# Patient Record
Sex: Male | Born: 1970 | Race: White | Hispanic: No | Marital: Married | Smoking: Never smoker
Health system: Southern US, Community
[De-identification: ages and names within clinical notes are randomized; demographics above are authoritative.]

## PROBLEM LIST (undated history)

## (undated) DIAGNOSIS — E7800 Pure hypercholesterolemia, unspecified: Secondary | ICD-10-CM

## (undated) DIAGNOSIS — J309 Allergic rhinitis, unspecified: Secondary | ICD-10-CM

## (undated) DIAGNOSIS — I10 Essential (primary) hypertension: Secondary | ICD-10-CM

## (undated) DIAGNOSIS — F32A Depression, unspecified: Secondary | ICD-10-CM

## (undated) HISTORY — DX: Pure hypercholesterolemia, unspecified: E78.00

## (undated) HISTORY — DX: Depression, unspecified: F32.A

## (undated) HISTORY — DX: Essential (primary) hypertension: I10

## (undated) HISTORY — DX: Allergic rhinitis, unspecified: J30.9

## (undated) HISTORY — PX: NASAL SEPTUM SURGERY: SHX37

---

## 2014-08-19 ENCOUNTER — Ambulatory Visit: Payer: Self-pay

## 2014-08-19 ENCOUNTER — Ambulatory Visit (INDEPENDENT_AMBULATORY_CARE_PROVIDER_SITE_OTHER): Payer: BLUE CROSS/BLUE SHIELD | Admitting: Podiatry

## 2014-08-19 VITALS — BP 132/95 | HR 82 | Resp 15

## 2014-08-19 DIAGNOSIS — M722 Plantar fascial fibromatosis: Secondary | ICD-10-CM | POA: Diagnosis not present

## 2014-08-19 MED ORDER — TRIAMCINOLONE ACETONIDE 10 MG/ML IJ SUSP
10.0000 mg | Freq: Once | INTRAMUSCULAR | Status: AC
  Administered 2014-08-19: 10 mg

## 2014-08-19 MED ORDER — DICLOFENAC SODIUM 75 MG PO TBEC: 75.0000 mg | DELAYED_RELEASE_TABLET | Freq: Two times a day (BID) | ORAL | Status: DC

## 2014-08-19 NOTE — Progress Notes (Signed)
   Subjective:    Patient ID: Don Cochran, male    DOB: 05/17/1970, 44 y.o.   MRN: [id number]  HPI  pT PRESENTS WITH INTERMITTENT RIGHT HEEL PAIN, HAS HAD THIS PROBLEM FOR APPROX 1 YR, RECENTLY HAS TRIED MORE SUPPORTIVE SHOES WHICH HAS ALLEVIATED THE PAIN  Review of Systems  Allergic/Immunologic: Positive for environmental allergies.  All other systems reviewed and are negative.      Objective:   Physical Exam        Assessment & Plan:

## 2014-08-19 NOTE — Patient Instructions (Signed)

## 2014-08-20 NOTE — Progress Notes (Signed)
Subjective:     Patient ID: Don Cochran, male   DOB: 05/16/1970, 44 y.o.   MRN: [id number]  HPI patient presents stating I been having pain on and off and worse recently in my right heel for about one year. States it's worse when getting up in the morning and after periods of sitting   Review of Systems  All other systems reviewed and are negative.      Objective:   Physical Exam  Constitutional: He is oriented to person, place, and time.  Cardiovascular: Intact distal pulses.   Musculoskeletal: Normal range of motion.  Neurological: He is oriented to person, place, and time.  Skin: Skin is warm.  Nursing note and vitals reviewed.  neurovascular status intact muscle strength adequate with range of motion of the subtalar midtarsal joint within normal limits. Patient's found to have quite a bit of discomfort with inflammation and fluid buildup around the medial band of the right heel at the insertion of the tendon into the calcaneus with moderate depression of the arch. Patient has good digital perfusion and is well oriented 3     Assessment:     Plantar fasciitis with inflammation medial heel right    Plan:     H&P and x-rays reviewed with patient. Injected the right plantar fascia 3 mg Kenalog 5 mg Xylocaine and applied fascial brace with instructions on usage. Gave instructions on physical therapy and oral anti-inflammatories and discussed long-term orthotics with this patient

## 2014-08-21 ENCOUNTER — Telehealth: Payer: Self-pay | Admitting: *Deleted

## 2014-08-21 MED ORDER — DICLOFENAC SODIUM 75 MG PO TBEC: 75.0000 mg | DELAYED_RELEASE_TABLET | Freq: Two times a day (BID) | ORAL | Status: DC

## 2014-08-21 NOTE — Telephone Encounter (Signed)
Pt states he was seen by Dr. Charlsie Merlesegal on 08/19/2014, and the medication was not called to his pharmacy.  I reviewed pt's chart and no pharmacy was stated.  [facility] [address] at pt's request.

## 2014-08-27 ENCOUNTER — Ambulatory Visit (INDEPENDENT_AMBULATORY_CARE_PROVIDER_SITE_OTHER): Payer: BLUE CROSS/BLUE SHIELD | Admitting: Podiatry

## 2014-08-27 DIAGNOSIS — M722 Plantar fascial fibromatosis: Secondary | ICD-10-CM

## 2014-08-27 NOTE — Progress Notes (Signed)
   Subjective:    Patient ID: Don Cochran, male    DOB: 05/11/1970, 44 y.o.   MRN: [id number]  HPI Comments: Patient is doing much better with plantar fasciatis after using braces and receiving shots. May a candidate for orthotics based on chronic nature of his condition.    Review of Systems     Objective:   Physical Exam        Assessment & Plan:

## 2014-08-27 NOTE — Progress Notes (Signed)
Subjective:     Patient ID: Don EdwardSteven Chipman, male   DOB: 09/24/1970, 44 y.o.   MRN: [id number]  HPI patient presents stating I have had this for a long time and it's quite a bit better now but I still no that I need foot support   Review of Systems     Objective:   Physical Exam Neurovascular status intact with significant discomfort reduction right plantar heel with inflammation still noted of a mild nature. Also noted to have depression of the arch    Assessment:     Plantar fasciitis right with inflammation that is improved but still sore with mechanical dysfunction    Plan:     Reviewed importance of physical therapy supportive shoe gear usage and at this time scanned for custom Berkley type orthotics to give good support of the foot

## 2014-10-14 ENCOUNTER — Telehealth: Payer: Self-pay | Admitting: *Deleted

## 2014-10-14 NOTE — Telephone Encounter (Signed)
Pt states his plantar fasciitis symptoms have subsided, does he need to continue the antiinflammatory?  I instructed pt to complete the medication he had at this time, and use the remaining refill for flare-up, but if problem continued or flared up make an appt to be seen.

## 2014-10-17 ENCOUNTER — Ambulatory Visit: Payer: BLUE CROSS/BLUE SHIELD | Admitting: *Deleted

## 2014-10-17 DIAGNOSIS — M722 Plantar fascial fibromatosis: Secondary | ICD-10-CM

## 2014-10-17 NOTE — Progress Notes (Signed)
Patient ID: Don Cochran, male   DOB: 07/22/1970, 44 y.o.   MRN: [id number] Patient presents for orthotic pick up.  Verbal and written break in and wear instructions given.  Patient will follow up in 4 weeks if symptoms worsen or fail to improve.

## 2014-10-17 NOTE — Patient Instructions (Signed)

## 2014-11-09 ENCOUNTER — Other Ambulatory Visit: Payer: Self-pay | Admitting: Podiatry

## 2014-11-10 NOTE — Telephone Encounter (Signed)
Pt needs an appt prior to future refills. 

## 2015-07-09 DIAGNOSIS — L918 Other hypertrophic disorders of the skin: Secondary | ICD-10-CM | POA: Diagnosis not present

## 2015-07-09 DIAGNOSIS — L2084 Intrinsic (allergic) eczema: Secondary | ICD-10-CM | POA: Diagnosis not present

## 2015-11-04 DIAGNOSIS — Z0001 Encounter for general adult medical examination with abnormal findings: Secondary | ICD-10-CM | POA: Diagnosis not present

## 2015-11-04 DIAGNOSIS — E7800 Pure hypercholesterolemia, unspecified: Secondary | ICD-10-CM | POA: Diagnosis not present

## 2015-11-04 DIAGNOSIS — F429 Obsessive-compulsive disorder, unspecified: Secondary | ICD-10-CM | POA: Diagnosis not present

## 2015-11-04 DIAGNOSIS — M249 Joint derangement, unspecified: Secondary | ICD-10-CM | POA: Diagnosis not present

## 2015-11-04 DIAGNOSIS — M7741 Metatarsalgia, right foot: Secondary | ICD-10-CM | POA: Diagnosis not present

## 2016-01-11 DIAGNOSIS — H524 Presbyopia: Secondary | ICD-10-CM | POA: Diagnosis not present

## 2016-01-16 DIAGNOSIS — H5210 Myopia, unspecified eye: Secondary | ICD-10-CM | POA: Diagnosis not present

## 2016-04-05 DIAGNOSIS — L309 Dermatitis, unspecified: Secondary | ICD-10-CM | POA: Diagnosis not present

## 2016-04-05 DIAGNOSIS — L859 Epidermal thickening, unspecified: Secondary | ICD-10-CM | POA: Diagnosis not present

## 2016-05-18 DIAGNOSIS — F429 Obsessive-compulsive disorder, unspecified: Secondary | ICD-10-CM | POA: Diagnosis not present

## 2016-05-18 DIAGNOSIS — E7800 Pure hypercholesterolemia, unspecified: Secondary | ICD-10-CM | POA: Diagnosis not present

## 2016-06-16 DIAGNOSIS — B029 Zoster without complications: Secondary | ICD-10-CM | POA: Diagnosis not present

## 2016-09-09 DIAGNOSIS — H1032 Unspecified acute conjunctivitis, left eye: Secondary | ICD-10-CM | POA: Diagnosis not present

## 2017-02-01 DIAGNOSIS — M79671 Pain in right foot: Secondary | ICD-10-CM | POA: Diagnosis not present

## 2017-02-01 DIAGNOSIS — Z23 Encounter for immunization: Secondary | ICD-10-CM | POA: Diagnosis not present

## 2017-02-01 DIAGNOSIS — H9193 Unspecified hearing loss, bilateral: Secondary | ICD-10-CM | POA: Diagnosis not present

## 2017-02-01 DIAGNOSIS — Z0001 Encounter for general adult medical examination with abnormal findings: Secondary | ICD-10-CM | POA: Diagnosis not present

## 2017-02-01 DIAGNOSIS — E7800 Pure hypercholesterolemia, unspecified: Secondary | ICD-10-CM | POA: Diagnosis not present

## 2017-02-01 DIAGNOSIS — H6122 Impacted cerumen, left ear: Secondary | ICD-10-CM | POA: Diagnosis not present

## 2017-02-08 DIAGNOSIS — Z23 Encounter for immunization: Secondary | ICD-10-CM | POA: Diagnosis not present

## 2017-03-08 DIAGNOSIS — H524 Presbyopia: Secondary | ICD-10-CM | POA: Diagnosis not present

## 2017-03-08 DIAGNOSIS — H903 Sensorineural hearing loss, bilateral: Secondary | ICD-10-CM | POA: Diagnosis not present

## 2017-03-16 DIAGNOSIS — Z23 Encounter for immunization: Secondary | ICD-10-CM | POA: Diagnosis not present

## 2017-04-06 DIAGNOSIS — D485 Neoplasm of uncertain behavior of skin: Secondary | ICD-10-CM | POA: Diagnosis not present

## 2017-04-06 DIAGNOSIS — Z86018 Personal history of other benign neoplasm: Secondary | ICD-10-CM | POA: Diagnosis not present

## 2017-04-06 DIAGNOSIS — D225 Melanocytic nevi of trunk: Secondary | ICD-10-CM | POA: Diagnosis not present

## 2017-04-06 DIAGNOSIS — L821 Other seborrheic keratosis: Secondary | ICD-10-CM | POA: Diagnosis not present

## 2017-08-01 DIAGNOSIS — F429 Obsessive-compulsive disorder, unspecified: Secondary | ICD-10-CM | POA: Diagnosis not present

## 2017-08-01 DIAGNOSIS — E7800 Pure hypercholesterolemia, unspecified: Secondary | ICD-10-CM | POA: Diagnosis not present

## 2017-08-06 DIAGNOSIS — J309 Allergic rhinitis, unspecified: Secondary | ICD-10-CM | POA: Diagnosis not present

## 2017-08-06 DIAGNOSIS — B309 Viral conjunctivitis, unspecified: Secondary | ICD-10-CM | POA: Diagnosis not present

## 2017-11-27 DIAGNOSIS — H5210 Myopia, unspecified eye: Secondary | ICD-10-CM | POA: Diagnosis not present

## 2018-01-20 NOTE — Progress Notes (Signed)
[name] [address] [place], [place] [address] Phone: [phone] Subjective:    I Don Cochran am serving as a Neurosurgeon for Dr. Antoine Cochran.   I'm seeing this patient by the request  of:   Cochran, Don Dance, MD   CC: Bilateral hand pain  ION:[id number]  Don Cochran is a 47 y.o. male coming in with complaint of bilateral hand pain. From the elbow to his hands and knees to his feet are painful. Knows that it is an overuse injury that started about 10 years ago. No numbness and tingling noted. Sometimes he does get numbness and tingling in the 3rd and 4th finger when his elbows at a certain position. Thumb muscles fatigue at times. Works at a [employer]. Left knee is painful going up and down stairs. Going down is most painful. Lateral right foot is painful. Has a history of ganglion cyst and plantar fascia. Also has an issue of lateral epicondylitis. No numbness and tingling to the toes noted. Hasn't been able to exercise due to pain.  Onset- Chronic (2-3 years) Location- Wrist Patient states that the pain is not severe to stop him from any activity but continues to give difficulty from time to time.    History reviewed. No pertinent past medical history. History reviewed. No pertinent surgical history. Social History   Socioeconomic History  . Marital status: Married    Spouse name: Not on file  . Number of children: Not on file  . Years of education: Not on file  . Highest education level: Not on file  Occupational History  . Not on file  Social Needs  . Financial resource strain: Not on file  . Food insecurity:    Worry: Not on file    Inability: Not on file  . Transportation needs:    Medical: Not on file    Non-medical: Not on file  Tobacco Use  . Smoking status: Not on file  Substance and Sexual Activity  . Alcohol use: Not on file  . Drug use: Not on file  . Sexual activity: Not on file  Lifestyle  . Physical activity:      Days per week: Not on file    Minutes per session: Not on file  . Stress: Not on file  Relationships  . Social connections:    Talks on phone: Not on file    Gets together: Not on file    Attends religious service: Not on file    Active member of club or organization: Not on file    Attends meetings of clubs or organizations: Not on file    Relationship status: Not on file  Other Topics Concern  . Not on file  Social History Narrative  . Not on file   No Known Allergies History reviewed. No pertinent family history.   Current Outpatient Medications (Cardiovascular):  .  rosuvastatin (CRESTOR) 10 MG tablet, Take 10 mg by mouth daily.  Current Outpatient Medications (Respiratory):  .  loratadine (CLARITIN) 10 MG tablet, Take 10 mg by mouth daily.  Current Outpatient Medications (Analgesics):  .  diclofenac (VOLTAREN) 75 MG EC tablet, TAKE 1 TABLET (75 MG TOTAL) BY MOUTH 2 (TWO) TIMES DAILY. Marland Kitchen  ibuprofen (ADVIL,MOTRIN) 200 MG tablet, Take 200 mg by mouth every 6 (six) hours as needed.   Current Outpatient Medications (Other):  .  valACYclovir (VALTREX) 500 MG tablet, Take 500 mg by mouth 2 (two) times daily. Marland Kitchen  venlafaxine XR (  EFFEXOR-XR) 150 MG 24 hr capsule, Take 150 mg by mouth daily with breakfast. .  Diclofenac Sodium (PENNSAID) 2 % SOLN, Place 2 g onto the skin 2 (two) times daily. Marland Kitchen  gabapentin (NEURONTIN) 100 MG capsule, Take 2 capsules (200 mg total) by mouth at bedtime.    Past medical history, social, surgical and family history all reviewed in electronic medical record.  No pertanent information unless stated regarding to the chief complaint.   Review of Systems:  No headache, visual changes, nausea, vomiting, diarrhea, constipation, dizziness, abdominal pain, skin rash, fevers, chills, night sweats, weight loss, swollen lymph nodes, body aches, joint swelling,  chest pain, shortness of breath, mood changes.  Positive muscle aches  Objective  Blood pressure  132/90, pulse 84, height 5\' 6"  (1.676 m), weight 194 lb (88 kg), SpO2 97 %.   General: No apparent distress alert and oriented x3 mood and affect normal, dressed appropriately.  HEENT: Pupils equal, extraocular movements intact  Respiratory: Patient's speak in full sentences and does not appear short of breath  Cardiovascular: No lower extremity edema, non tender, no erythema  Skin: Warm dry intact with no signs of infection or rash on extremities or on axial skeleton.  Abdomen: Soft nontender  Neuro: Cranial nerves II through XII are intact, neurovascularly intact in all extremities with 2+ DTRs and 2+ pulses.  Lymph: No lymphadenopathy of posterior or anterior cervical chain or axillae bilaterally.  Gait normal with good balance and coordination.  MSK:  Non tender with full range of motion and good stability and symmetric strength and tone of shoulders,  wrist, hip, knee and ankles bilaterally.  Elbow: Bilateral Unremarkable to inspection. Range of motion full pronation, supination, flexion, extension. Strength is full to all of the above directions Stable to varus, valgus stress. Negative moving valgus stress test. Tender over the ulnar groove bilaterally Ulnar nerve does subluxation bilaterally Positive cubital tunnel Tinel's.`   Musculoskeletal ultrasound was performed and interpreted by Terrilee Files D.O.   Elbow:  Lateral epicondyle and common extensor tendon origin visualized.  No edema, effusions, or avulsions seen.  Ulnar nerve does seem to be significantly superficial.  Does not seem to sit in the ulnar groove.  Mild chronic hypoechoic changes surrounding the area.  No increase in Doppler flow.  IMPRESSION: Ulnar subluxation    Impression and Recommendations:     This case required medical decision making of moderate complexity. The above documentation has been reviewed and is accurate and complete Judi Saa, DO       Note: This dictation was prepared with Dragon  dictation along with smaller phrase technology. Any transcriptional errors that result from this process are unintentional.

## 2018-01-22 ENCOUNTER — Ambulatory Visit: Payer: Self-pay

## 2018-01-22 ENCOUNTER — Ambulatory Visit: Payer: BLUE CROSS/BLUE SHIELD | Admitting: Family Medicine

## 2018-01-22 ENCOUNTER — Other Ambulatory Visit (INDEPENDENT_AMBULATORY_CARE_PROVIDER_SITE_OTHER): Payer: BLUE CROSS/BLUE SHIELD

## 2018-01-22 ENCOUNTER — Encounter: Payer: Self-pay | Admitting: Family Medicine

## 2018-01-22 VITALS — BP 132/90 | HR 84 | Ht 66.0 in | Wt 194.0 lb

## 2018-01-22 DIAGNOSIS — M2550 Pain in unspecified joint: Secondary | ICD-10-CM

## 2018-01-22 DIAGNOSIS — M25531 Pain in right wrist: Secondary | ICD-10-CM

## 2018-01-22 DIAGNOSIS — M25532 Pain in left wrist: Principal | ICD-10-CM

## 2018-01-22 DIAGNOSIS — G5620 Lesion of ulnar nerve, unspecified upper limb: Secondary | ICD-10-CM

## 2018-01-22 LAB — COMPREHENSIVE METABOLIC PANEL
ALBUMIN: 4.9 g/dL (ref 3.5–5.2)
ALT: 33 U/L (ref 0–53)
AST: 21 U/L (ref 0–37)
Alkaline Phosphatase: 53 U/L (ref 39–117)
BUN: 15 mg/dL (ref 6–23)
CHLORIDE: 101 meq/L (ref 96–112)
CO2: 30 mEq/L (ref 19–32)
CREATININE: 0.99 mg/dL (ref 0.40–1.50)
Calcium: 9.6 mg/dL (ref 8.4–10.5)
GFR: 86.01 mL/min (ref >60.00)
GLUCOSE: 94 mg/dL (ref 70–99)
Potassium: 4.1 mEq/L (ref 3.5–5.1)
SODIUM: 139 meq/L (ref 135–145)
Total Bilirubin: 0.3 mg/dL (ref 0.2–1.2)
Total Protein: 7 g/dL (ref 6.0–8.3)

## 2018-01-22 LAB — URIC ACID: URIC ACID, SERUM: 4.7 mg/dL (ref 4.0–7.8)

## 2018-01-22 LAB — CBC WITH DIFFERENTIAL/PLATELET
BASOS PCT: 0.6 % (ref 0.0–3.0)
Basophils Absolute: 0 10*3/uL (ref 0.0–0.1)
EOS ABS: 0.2 10*3/uL (ref 0.0–0.7)
Eosinophils Relative: 2.9 % (ref 0.0–5.0)
HCT: 43.9 % (ref 39.0–52.0)
HEMOGLOBIN: 14.6 g/dL (ref 13.0–17.0)
Lymphocytes Relative: 25 % (ref 12.0–46.0)
Lymphs Abs: 1.6 10*3/uL (ref 0.7–4.0)
MCHC: 33.2 g/dL (ref 30.0–36.0)
MCV: 88 fl (ref 78.0–100.0)
MONO ABS: 0.4 10*3/uL (ref 0.1–1.0)
Monocytes Relative: 6.4 % (ref 3.0–12.0)
NEUTROS PCT: 65.1 % (ref 43.0–77.0)
Neutro Abs: 4.2 10*3/uL (ref 1.4–7.7)
Platelets: 273 10*3/uL (ref 150.0–400.0)
RBC: 4.99 Mil/uL (ref 4.22–5.81)
RDW: 13.6 % (ref 11.5–15.5)
WBC: 6.4 10*3/uL (ref 4.0–10.5)

## 2018-01-22 LAB — SEDIMENTATION RATE: Sed Rate: 14 mm/hr (ref 0–15)

## 2018-01-22 LAB — C-REACTIVE PROTEIN: CRP: 0.6 mg/dL (ref 0.5–20.0)

## 2018-01-22 LAB — FERRITIN: FERRITIN: 86.5 ng/mL (ref 22.0–322.0)

## 2018-01-22 LAB — IBC PANEL
Iron: 89 ug/dL (ref 42–165)
SATURATION RATIOS: 24.4 % (ref 20.0–50.0)
Transferrin: 261 mg/dL (ref 212.0–360.0)

## 2018-01-22 LAB — TSH: TSH: 1.32 u[IU]/mL (ref 0.35–4.50)

## 2018-01-22 MED ORDER — DICLOFENAC SODIUM 2 % TD SOLN: 2.0000 g | Freq: Two times a day (BID) | TRANSDERMAL | 3 refills | Status: DC

## 2018-01-22 MED ORDER — GABAPENTIN 100 MG PO CAPS: 200.0000 mg | ORAL_CAPSULE | Freq: Every day | ORAL | 3 refills | Status: DC

## 2018-01-22 NOTE — Assessment & Plan Note (Signed)
Bilateral.  Started gabapentin, discussed compression.  Seems to be getting compression from patient's work.  Discussed icing regimen and home exercise.  Discussed patient labs.  Discussed gabapentin at night.  Patient will likely do well with conservative therapy.  Follow-up with me again in 4 weeks.

## 2018-01-22 NOTE — Patient Instructions (Addendum)
Good to see you.  Ice 20 minutes 2 times daily. Usually after activity and before bed. pennsaid pinkie amount topically 2 times daily as needed.  Gabapentni 200mg  at night Try to avoid putting the elbows on hard surfaces Compression sleeve could be good.  Labs downstairs Over the counter get  Vitamin D 2000 IU daily  Turmeric 500mg  daily  See me again in 4 weeks

## 2018-01-24 LAB — TESTOSTERONE, FREE, TOTAL, SHBG
Sex Hormone Binding: 27.1 nmol/L (ref 16.5–55.9)
TESTOSTERONE: 173 ng/dL — AB (ref 264–916)
Testosterone, Free: 4.2 pg/mL — ABNORMAL LOW (ref 6.8–21.5)

## 2018-01-26 ENCOUNTER — Telehealth: Payer: Self-pay | Admitting: Family Medicine

## 2018-01-26 LAB — RHEUMATOID FACTOR: Rhuematoid fact SerPl-aCnc: <14 IU/mL (ref <14)

## 2018-01-26 LAB — CALCIUM, IONIZED: Calcium, Ion: 4.94 mg/dL (ref 4.8–5.6)

## 2018-01-26 LAB — PTH, INTACT AND CALCIUM
CALCIUM: 9.6 mg/dL (ref 8.6–10.3)
PTH: 51 pg/mL (ref 14–64)

## 2018-01-26 LAB — ANGIOTENSIN CONVERTING ENZYME: ANGIOTENSIN-CONVERTING ENZYME: 40 U/L (ref 9–67)

## 2018-01-26 LAB — VITAMIN D 1,25 DIHYDROXY
VITAMIN D 1, 25 (OH) TOTAL: 46 pg/mL (ref 18–72)
VITAMIN D3 1, 25 (OH): 46 pg/mL
Vitamin D2 1, 25 (OH)2: <8 pg/mL

## 2018-01-26 LAB — ANA: ANA: NEGATIVE

## 2018-01-26 LAB — CYCLIC CITRUL PEPTIDE ANTIBODY, IGG: Cyclic Citrullin Peptide Ab: <16 UNITS

## 2018-01-26 NOTE — Telephone Encounter (Signed)
Copied from CRM [id number]. Topic: Quick Communication - Rx Refill/Question >> Jan 26, 2018  9:10 AM Baldo Daub L wrote: Medication: Diclofenac Sodium (PENNSAID) 2 % SOLN  Pt states that pharmacy can't fill this because it needs insurance authorization.  Pt can be reached at [phone]

## 2018-01-30 NOTE — Telephone Encounter (Signed)
Pt calling back to follow up on prior auth for the pennsaid. [facility] [place] [facility]  Advised pt this is usually sent to a specialty pharmacy, but would have someone look it to it for him and call back.

## 2018-01-30 NOTE — Telephone Encounter (Signed)
Spoke to pt, he is going to pick up samples at front desk of pennsaid for him to try first before sending into pharmacy.

## 2018-02-13 DIAGNOSIS — Z125 Encounter for screening for malignant neoplasm of prostate: Secondary | ICD-10-CM | POA: Diagnosis not present

## 2018-02-13 DIAGNOSIS — Z0001 Encounter for general adult medical examination with abnormal findings: Secondary | ICD-10-CM | POA: Diagnosis not present

## 2018-02-13 DIAGNOSIS — E7800 Pure hypercholesterolemia, unspecified: Secondary | ICD-10-CM | POA: Diagnosis not present

## 2018-02-15 ENCOUNTER — Other Ambulatory Visit: Payer: Self-pay | Admitting: Family Medicine

## 2018-02-18 NOTE — Progress Notes (Signed)
[facility] Brilyn [facility] D.O. [facility] [address] [place], [place] [address] Phone: [phone] Subjective:   Don Cochran, Don Cochran, am serving as a scribe for Dr. Antoine PrimasZachary Hedy Cochran.   CC: Bilateral wrist pain  BJY:[id number]:Subjective  Don Cochran is a 47 y.o. male coming in with complaint of bilateral wrist pain. Is using turmeric and vitamin d. Does feel like he has improved. Has had a few days with achy pain where he used pennsaid to alleviate his pain. Does note weakness with dexterity of thumb and 2nd finger. Has been stretching and feels like he may have pulled the extensors of the right arm as well.  Patient states nothing that stops him from activity.  Patient continues to have some discomfort and is wondering if he is going to have to live with it.     No past medical history on file. No past surgical history on file. Social History   Socioeconomic History  . Marital status: Married    Spouse name: Not on file  . Number of children: Not on file  . Years of education: Not on file  . Highest education level: Not on file  Occupational History  . Not on file  Social Needs  . Financial resource strain: Not on file  . Food insecurity:    Worry: Not on file    Inability: Not on file  . Transportation needs:    Medical: Not on file    Non-medical: Not on file  Tobacco Use  . Smoking status: Not on file  Substance and Sexual Activity  . Alcohol use: Not on file  . Drug use: Not on file  . Sexual activity: Not on file  Lifestyle  . Physical activity:    Days per week: Not on file    Minutes per session: Not on file  . Stress: Not on file  Relationships  . Social connections:    Talks on phone: Not on file    Gets together: Not on file    Attends religious service: Not on file    Active member of club or organization: Not on file    Attends meetings of clubs or organizations: Not on file    Relationship status: Not on file  Other Topics Concern  . Not on file  Social History  Narrative  . Not on file   No Known Allergies No family history on file.   Current Outpatient Medications (Cardiovascular):  .  rosuvastatin (CRESTOR) 10 MG tablet, Take 10 mg by mouth daily.  Current Outpatient Medications (Respiratory):  .  loratadine (CLARITIN) 10 MG tablet, Take 10 mg by mouth daily.  Current Outpatient Medications (Analgesics):  .  diclofenac (VOLTAREN) 75 MG EC tablet, TAKE 1 TABLET (75 MG TOTAL) BY MOUTH 2 (TWO) TIMES DAILY. Marland Kitchen.  ibuprofen (ADVIL,MOTRIN) 200 MG tablet, Take 200 mg by mouth every 6 (six) hours as needed.   Current Outpatient Medications (Other):  Marland Kitchen.  Diclofenac Sodium (PENNSAID) 2 % SOLN, Place 2 g onto the skin 2 (two) times daily. Marland Kitchen.  gabapentin (NEURONTIN) 100 MG capsule, TAKE 2 CAPSULES BY MOUTH AT BEDTIME .  valACYclovir (VALTREX) 500 MG tablet, Take 500 mg by mouth 2 (two) times daily. Marland Kitchen.  venlafaxine XR (EFFEXOR-XR) 150 MG 24 hr capsule, Take 150 mg by mouth daily with breakfast.    Past medical history, social, surgical and family history all reviewed in electronic medical record.  No pertanent information unless stated regarding to the chief complaint.   Review  of Systems:  No headache, visual changes, nausea, vomiting, diarrhea, constipation, dizziness, abdominal pain, skin rash, fevers, chills, night sweats, weight loss, swollen lymph nodes, body aches, joint swelling, , chest pain, shortness of breath, mood changes.  Positive muscle aches  Objective  Blood pressure (!) 140/98, pulse 75, height 5\' 6"  (1.676 m), weight 202 lb (91.6 kg), SpO2 98 %.   General: No apparent distress alert and oriented x3 mood and affect normal, dressed appropriately.  HEENT: Pupils equal, extraocular movements intact  Respiratory: Patient's speak in full sentences and does not appear short of breath  Cardiovascular: No lower extremity edema, non tender, no erythema  Skin: Warm dry intact with no signs of infection or rash on extremities or on axial  skeleton.  Abdomen: Soft nontender  Neuro: Cranial nerves II through XII are intact, neurovascularly intact in all extremities with 2+ DTRs and 2+ pulses.  Lymph: No lymphadenopathy of posterior or anterior cervical chain or axillae bilaterally.  Gait normal with good balance and coordination.  MSK:  Non tender with full range of motion and good stability and symmetric strength and tone of shoulders, elbows, wrist, hip, knee and ankles bilaterally.  Hypermobility noted    Impression and Recommendations:     The above documentation has been reviewed and is accurate and complete Judi Saa, DO       Note: This dictation was prepared with Dragon dictation along with smaller phrase technology. Any transcriptional errors that result from this process are unintentional.

## 2018-02-19 ENCOUNTER — Ambulatory Visit: Payer: BLUE CROSS/BLUE SHIELD | Admitting: Family Medicine

## 2018-02-19 VITALS — BP 140/98 | HR 75 | Ht 66.0 in | Wt 202.0 lb

## 2018-02-19 DIAGNOSIS — M25532 Pain in left wrist: Secondary | ICD-10-CM

## 2018-02-19 DIAGNOSIS — M2550 Pain in unspecified joint: Secondary | ICD-10-CM | POA: Diagnosis not present

## 2018-02-19 DIAGNOSIS — M25531 Pain in right wrist: Secondary | ICD-10-CM

## 2018-02-19 NOTE — Patient Instructions (Signed)
God to see you  Stop the range of motion exercises for all joints and focus on strength  Pt for the wrist and stability  Continue the vitamins Tape wrists with a lot of activity  See me again in 2 months

## 2018-02-19 NOTE — Assessment & Plan Note (Signed)
I believe that some of patient's pain is secondary to hypermobility.  Patient will be started with formal physical therapy to help with stability of multiple joints especially more of the wrist in the shoulders and elbows in the upper extremities that I think are more beneficial.  Discussed with patient about icing regimen, home exercise, which activities to do which wants to avoid.  Patient will increase activity slowly over the course the next several weeks.  Follow-up with me again 4 to 8 weeks

## 2018-03-05 DIAGNOSIS — M25532 Pain in left wrist: Secondary | ICD-10-CM | POA: Diagnosis not present

## 2018-03-05 DIAGNOSIS — M25531 Pain in right wrist: Secondary | ICD-10-CM | POA: Diagnosis not present

## 2018-03-05 DIAGNOSIS — M25521 Pain in right elbow: Secondary | ICD-10-CM | POA: Diagnosis not present

## 2018-03-05 DIAGNOSIS — M6281 Muscle weakness (generalized): Secondary | ICD-10-CM | POA: Diagnosis not present

## 2018-03-07 DIAGNOSIS — M6281 Muscle weakness (generalized): Secondary | ICD-10-CM | POA: Diagnosis not present

## 2018-03-07 DIAGNOSIS — M25531 Pain in right wrist: Secondary | ICD-10-CM | POA: Diagnosis not present

## 2018-03-07 DIAGNOSIS — M25521 Pain in right elbow: Secondary | ICD-10-CM | POA: Diagnosis not present

## 2018-03-07 DIAGNOSIS — M25532 Pain in left wrist: Secondary | ICD-10-CM | POA: Diagnosis not present

## 2018-03-12 DIAGNOSIS — M25531 Pain in right wrist: Secondary | ICD-10-CM | POA: Diagnosis not present

## 2018-03-12 DIAGNOSIS — M25521 Pain in right elbow: Secondary | ICD-10-CM | POA: Diagnosis not present

## 2018-03-12 DIAGNOSIS — M25532 Pain in left wrist: Secondary | ICD-10-CM | POA: Diagnosis not present

## 2018-03-12 DIAGNOSIS — M6281 Muscle weakness (generalized): Secondary | ICD-10-CM | POA: Diagnosis not present

## 2018-03-14 DIAGNOSIS — M6281 Muscle weakness (generalized): Secondary | ICD-10-CM | POA: Diagnosis not present

## 2018-03-14 DIAGNOSIS — M25532 Pain in left wrist: Secondary | ICD-10-CM | POA: Diagnosis not present

## 2018-03-14 DIAGNOSIS — M25521 Pain in right elbow: Secondary | ICD-10-CM | POA: Diagnosis not present

## 2018-03-14 DIAGNOSIS — M25531 Pain in right wrist: Secondary | ICD-10-CM | POA: Diagnosis not present

## 2018-03-22 DIAGNOSIS — M25531 Pain in right wrist: Secondary | ICD-10-CM | POA: Diagnosis not present

## 2018-03-22 DIAGNOSIS — M6281 Muscle weakness (generalized): Secondary | ICD-10-CM | POA: Diagnosis not present

## 2018-03-22 DIAGNOSIS — M25532 Pain in left wrist: Secondary | ICD-10-CM | POA: Diagnosis not present

## 2018-03-22 DIAGNOSIS — M25521 Pain in right elbow: Secondary | ICD-10-CM | POA: Diagnosis not present

## 2018-04-11 DIAGNOSIS — M25531 Pain in right wrist: Secondary | ICD-10-CM | POA: Diagnosis not present

## 2018-04-11 DIAGNOSIS — M25521 Pain in right elbow: Secondary | ICD-10-CM | POA: Diagnosis not present

## 2018-04-11 DIAGNOSIS — M6281 Muscle weakness (generalized): Secondary | ICD-10-CM | POA: Diagnosis not present

## 2018-04-11 DIAGNOSIS — M25532 Pain in left wrist: Secondary | ICD-10-CM | POA: Diagnosis not present

## 2018-04-11 NOTE — Progress Notes (Signed)
Don Cochran [address] [place], [place] [address] Phone: [phone] Subjective:       CC: Ulnar subluxation follow-up  ELT:[id number]  Don Cochran is a 48 y.o. male coming in with complaint of bilateral wrist pain. He said that he has improved but is having an increase in elbow pain, right side. Has been going to physical therapy with focus on brachioradialis and biceps tendon insertion pain in the elbow. Patient feels like he could transition to doing home exercises but does state that he isn't doing the current recommendations from the physical therapist at home. Does mention feeling depressed by the lack of progress with the elbow pain. Wonders if the change in his medication was not the correct choice. Continues to use turmeric, DHEA, and vitamin D.      No past medical history on file. No past surgical history on file. Social History   Socioeconomic History  . Marital status: Married    Spouse name: Not on file  . Number of children: Not on file  . Years of education: Not on file  . Highest education level: Not on file  Occupational History  . Not on file  Social Needs  . Financial resource strain: Not on file  . Food insecurity:    Worry: Not on file    Inability: Not on file  . Transportation needs:    Medical: Not on file    Non-medical: Not on file  Tobacco Use  . Smoking status: Not on file  Substance and Sexual Activity  . Alcohol use: Not on file  . Drug use: Not on file  . Sexual activity: Not on file  Lifestyle  . Physical activity:    Days per week: Not on file    Minutes per session: Not on file  . Stress: Not on file  Relationships  . Social connections:    Talks on phone: Not on file    Gets together: Not on file    Attends religious service: Not on file    Active member of club or organization: Not on file    Attends meetings of clubs or organizations: Not on file    Relationship status: Not on file  Other  Topics Concern  . Not on file  Social History Narrative  . Not on file   No Known Allergies No family history on file.   Current Outpatient Medications (Cardiovascular):  .  rosuvastatin (CRESTOR) 10 MG tablet, Take 10 mg by mouth daily.  Current Outpatient Medications (Respiratory):  .  loratadine (CLARITIN) 10 MG tablet, Take 10 mg by mouth daily.  Current Outpatient Medications (Analgesics):  .  diclofenac (VOLTAREN) 75 MG EC tablet, TAKE 1 TABLET (75 MG TOTAL) BY MOUTH 2 (TWO) TIMES DAILY. Marland Kitchen  ibuprofen (ADVIL,MOTRIN) 200 MG tablet, Take 200 mg by mouth every 6 (six) hours as needed.   Current Outpatient Medications (Other):  Marland Kitchen  Diclofenac Sodium (PENNSAID) 2 % SOLN, Place 2 g onto the skin 2 (two) times daily. Marland Kitchen  gabapentin (NEURONTIN) 100 MG capsule, TAKE 2 CAPSULES BY MOUTH AT BEDTIME .  valACYclovir (VALTREX) 500 MG tablet, Take 500 mg by mouth 2 (two) times daily. Marland Kitchen  venlafaxine XR (EFFEXOR-XR) 150 MG 24 hr capsule, Take 150 mg by mouth daily with breakfast.    Past medical history, social, surgical and family history all reviewed in electronic medical record.  No pertanent information unless stated regarding to the chief complaint.   Review of  Systems:  No headache, visual changes, nausea, vomiting, diarrhea, constipation, dizziness, abdominal pain, skin rass, chest pain, shortness of breath, mood changes.  Positive muscle aches, body aches  Objective  Blood pressure 122/90, pulse 84, height 5\' 6"  (1.676 m), weight 200 lb 12.8 oz (91.1 kg), SpO2 98 %.   General: No apparent distress alert and oriented x3 mood and affect normal, dressed appropriately.  HEENT: Pupils equal, extraocular movements intact  Respiratory: Patient's speak in full sentences and does not appear short of breath  Cardiovascular: No lower extremity edema, non tender, no erythema  Skin: Warm dry intact with no signs of infection or rash on extremities or on axial skeleton.  Abdomen: Soft nontender    Neuro: Cranial nerves II through XII are intact, neurovascularly intact in all extremities with 2+ DTRs and 2+ pulses.  Lymph: No lymphadenopathy of posterior or anterior cervical chain or axillae bilaterally.  Gait normal with good balance and coordination.  MSK:  tender with full range of motion and good stability and symmetric strength and tone of shoulders,  wrist, hip, knee and ankles bilaterally.  Elbow: Bilateral Unremarkable to inspection. Range of motion full pronation, supination, flexion, extension. Strength is full to all of the above directions Stable to varus, valgus stress. Negative moving valgus stress test. Diffuse Ulnar nerve does not sublux. Negative cubital tunnel Tinel's.   Impression and Recommendations:     This case required medical decision making of moderate complexity. The above documentation has been reviewed and is accurate and complete Judi Saa, DO       Note: This dictation was prepared with Dragon dictation along with smaller phrase technology. Any transcriptional errors that result from this process are unintentional.

## 2018-04-12 ENCOUNTER — Other Ambulatory Visit: Payer: BLUE CROSS/BLUE SHIELD

## 2018-04-12 ENCOUNTER — Ambulatory Visit: Payer: BLUE CROSS/BLUE SHIELD | Admitting: Family Medicine

## 2018-04-12 VITALS — BP 122/90 | HR 84 | Ht 66.0 in | Wt 200.8 lb

## 2018-04-12 DIAGNOSIS — M2550 Pain in unspecified joint: Secondary | ICD-10-CM

## 2018-04-12 NOTE — Patient Instructions (Addendum)
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

## 2018-04-12 NOTE — Assessment & Plan Note (Signed)
Polyarthralgia.  Patient's pain seems to be out of proportion.  Differential includes a chronic pain syndrome, chronic Lyme disease, or patient's low testosterone or fibromyalgia.  At this point I would like to retest patient's testosterone and if still low likely referral to endocrinology.  Patient will have other tests also ordered and laboratory work-up today.  Discussed with patient again about other over-the-counter medications as well.  Patient does have some of the hypermobility that is likely contributing to some of the aches and pains but I do not feel severe enough to be causing the majority of his discomfort.  Physical exam and x-rays and other work-up has been fairly unremarkable otherwise.  Encourage patient to continue the other vitamin supplements.  Follow-up with me again in 4 weeks spent  25 minutes with patient face-to-face and had greater than 50% of counseling including as described above in assessment and plan.

## 2018-04-13 DIAGNOSIS — D485 Neoplasm of uncertain behavior of skin: Secondary | ICD-10-CM | POA: Diagnosis not present

## 2018-04-13 DIAGNOSIS — L821 Other seborrheic keratosis: Secondary | ICD-10-CM | POA: Diagnosis not present

## 2018-04-13 DIAGNOSIS — D225 Melanocytic nevi of trunk: Secondary | ICD-10-CM | POA: Diagnosis not present

## 2018-04-13 DIAGNOSIS — Z86018 Personal history of other benign neoplasm: Secondary | ICD-10-CM | POA: Diagnosis not present

## 2018-04-14 LAB — LYME DISEASE, WESTERN BLOT
IGG P93 AB.: ABSENT
IgG P18 Ab.: ABSENT
IgG P23 Ab.: ABSENT
IgG P28 Ab.: ABSENT
IgG P30 Ab.: ABSENT
IgG P39 Ab.: ABSENT
IgG P41 Ab.: ABSENT
IgG P45 Ab.: ABSENT
IgG P58 Ab.: ABSENT
IgG P66 Ab.: ABSENT
IgM P23 Ab.: ABSENT
IgM P39 Ab.: ABSENT
IgM P41 Ab.: ABSENT
Lyme IgG Wb: NEGATIVE
Lyme IgM Wb: NEGATIVE

## 2018-04-14 LAB — HEAVY METALS PANEL, BLOOD
Arsenic: <10 mcg/L (ref <23)
Lead: <1 ug/dL (ref <5)
Mercury, B: <5 mcg/L (ref 0–10)

## 2018-04-14 LAB — TESTOSTERONE, FREE, TOTAL, SHBG
Sex Hormone Binding: 20.4 nmol/L (ref 16.5–55.9)
TESTOSTERONE: 137 ng/dL — AB (ref 264–916)
Testosterone, Free: 4.8 pg/mL — ABNORMAL LOW (ref 6.8–21.5)

## 2018-04-18 DIAGNOSIS — M25521 Pain in right elbow: Secondary | ICD-10-CM | POA: Diagnosis not present

## 2018-04-18 DIAGNOSIS — M25532 Pain in left wrist: Secondary | ICD-10-CM | POA: Diagnosis not present

## 2018-04-18 DIAGNOSIS — M6281 Muscle weakness (generalized): Secondary | ICD-10-CM | POA: Diagnosis not present

## 2018-04-18 DIAGNOSIS — M25531 Pain in right wrist: Secondary | ICD-10-CM | POA: Diagnosis not present

## 2018-04-20 ENCOUNTER — Ambulatory Visit: Payer: BLUE CROSS/BLUE SHIELD | Admitting: Family Medicine

## 2018-04-24 DIAGNOSIS — M6281 Muscle weakness (generalized): Secondary | ICD-10-CM | POA: Diagnosis not present

## 2018-04-24 DIAGNOSIS — M25532 Pain in left wrist: Secondary | ICD-10-CM | POA: Diagnosis not present

## 2018-04-24 DIAGNOSIS — M25531 Pain in right wrist: Secondary | ICD-10-CM | POA: Diagnosis not present

## 2018-04-24 DIAGNOSIS — M25521 Pain in right elbow: Secondary | ICD-10-CM | POA: Diagnosis not present

## 2018-05-08 DIAGNOSIS — M25532 Pain in left wrist: Secondary | ICD-10-CM | POA: Diagnosis not present

## 2018-05-08 DIAGNOSIS — M6281 Muscle weakness (generalized): Secondary | ICD-10-CM | POA: Diagnosis not present

## 2018-05-08 DIAGNOSIS — M25531 Pain in right wrist: Secondary | ICD-10-CM | POA: Diagnosis not present

## 2018-05-08 DIAGNOSIS — M25521 Pain in right elbow: Secondary | ICD-10-CM | POA: Diagnosis not present

## 2018-05-24 NOTE — Progress Notes (Signed)
[name] [address] [place], [place] [address] Phone: [phone] Subjective:     CC: Right multiple complaints  FEO:[id number]  Don Cochran is a 48 y.o. male coming in with complaint of multiple pains.  Patient states that not making progress.  Found to have more of an ulnar subluxation.  Discussed which activities to do which wants to avoid.  Patient has not been making any significant provement with any of the vitamin supplementations.  Patient did have laboratory work-up that showed testosterone being low.  Was 176 and worsening to 136.     No past medical history on file. No past surgical history on file. Social History   Socioeconomic History  . Marital status: Married    Spouse name: Not on file  . Number of children: Not on file  . Years of education: Not on file  . Highest education level: Not on file  Occupational History  . Not on file  Social Needs  . Financial resource strain: Not on file  . Food insecurity:    Worry: Not on file    Inability: Not on file  . Transportation needs:    Medical: Not on file    Non-medical: Not on file  Tobacco Use  . Smoking status: Never Smoker  . Smokeless tobacco: Never Used  Substance and Sexual Activity  . Alcohol use: Not on file  . Drug use: Not on file  . Sexual activity: Not on file  Lifestyle  . Physical activity:    Days per week: Not on file    Minutes per session: Not on file  . Stress: Not on file  Relationships  . Social connections:    Talks on phone: Not on file    Gets together: Not on file    Attends religious service: Not on file    Active member of club or organization: Not on file    Attends meetings of clubs or organizations: Not on file    Relationship status: Not on file  Other Topics Concern  . Not on file  Social History Narrative  . Not on file   No Known Allergies No family history on file.  No family history of autoimmune   Current Outpatient  Medications (Cardiovascular):  .  rosuvastatin (CRESTOR) 10 MG tablet, Take 10 mg by mouth daily.  Current Outpatient Medications (Respiratory):  .  loratadine (CLARITIN) 10 MG tablet, Take 10 mg by mouth daily.  Current Outpatient Medications (Analgesics):  .  diclofenac (VOLTAREN) 75 MG EC tablet, TAKE 1 TABLET (75 MG TOTAL) BY MOUTH 2 (TWO) TIMES DAILY. Marland Kitchen  ibuprofen (ADVIL,MOTRIN) 200 MG tablet, Take 200 mg by mouth every 6 (six) hours as needed.   Current Outpatient Medications (Other):  Marland Kitchen  Diclofenac Sodium (PENNSAID) 2 % SOLN, Place 2 g onto the skin 2 (two) times daily. Marland Kitchen  gabapentin (NEURONTIN) 100 MG capsule, TAKE 2 CAPSULES BY MOUTH AT BEDTIME .  valACYclovir (VALTREX) 500 MG tablet, Take 500 mg by mouth 2 (two) times daily. .  cyclobenzaprine (FLEXERIL) 10 MG tablet, Take 1 tablet (10 mg total) by mouth at bedtime. Marland Kitchen  desvenlafaxine (PRISTIQ) 50 MG 24 hr tablet,     Past medical history, social, surgical and family history all reviewed in electronic medical record.  No pertanent information unless stated regarding to the chief complaint.   Review of Systems:  No headache, visual changes, nausea, vomiting, diarrhea, constipation, dizziness, abdominal pain, skin rash, fevers, chills, night  sweats, weight loss, swollen lymph nodes, s, chest pain, shortness of breath, mood changes.  Positive muscle aches, body aches  Objective  Blood pressure 126/80, pulse 82, height 5\' 6"  (1.676 m), weight 200 lb (90.7 kg), SpO2 97 %.    General: No apparent distress alert and oriented x3 mood and affect normal, dressed appropriately.  HEENT: Pupils equal, extraocular movements intact  Respiratory: Patient's speak in full sentences and does not appear short of breath  Cardiovascular: No lower extremity edema, non tender, no erythema  Skin: Warm dry intact with no signs of infection or rash on extremities or on axial skeleton.  Abdomen: Soft nontender  Neuro: Cranial nerves II through XII  are intact, neurovascularly intact in all extremities with 2+ DTRs and 2+ pulses.  Lymph: No lymphadenopathy of posterior or anterior cervical chain or axillae bilaterally.  Gait normal with good balance and coordination.  MSK:  tender with full range of motion and good stability and symmetric strength and tone of shoulders, elbows, wrist, hip, knee and ankles bilaterally.  Benign hypermobility     Impression and Recommendations:     This case required medical decision making of moderate complexity. The above documentation has been reviewed and is accurate and complete Judi Saa, DO       Note: This dictation was prepared with Dragon dictation along with smaller phrase technology. Any transcriptional errors that result from this process are unintentional.

## 2018-05-28 ENCOUNTER — Encounter: Payer: Self-pay | Admitting: Family Medicine

## 2018-05-28 ENCOUNTER — Ambulatory Visit: Payer: BLUE CROSS/BLUE SHIELD | Admitting: Family Medicine

## 2018-05-28 VITALS — BP 126/80 | HR 82 | Ht 66.0 in | Wt 200.0 lb

## 2018-05-28 DIAGNOSIS — R7989 Other specified abnormal findings of blood chemistry: Secondary | ICD-10-CM | POA: Diagnosis not present

## 2018-05-28 DIAGNOSIS — M2550 Pain in unspecified joint: Secondary | ICD-10-CM

## 2018-05-28 MED ORDER — CYCLOBENZAPRINE HCL 10 MG PO TABS: 10.0000 mg | ORAL_TABLET | Freq: Every day | ORAL | 0 refills | Status: DC

## 2018-05-28 NOTE — Patient Instructions (Addendum)
Good to see you  Endocrinology will be calling you  When coming to the DHEA or gabapentin ok to stop one but do it for a week and see how you do  For the back if needed take flexeril at night when having spasm Exercises when you feel the spsam as well  Write me after you see endocrinology and we will discuss next steps

## 2018-05-28 NOTE — Assessment & Plan Note (Signed)
Low testosterone.  Discussed which activities of doing which wants to avoid.  Discussed with patient and could cause some of the pain  Refer to endocrinology  Spent  25 minutes with patient face-to-face and had greater than 50% of counseling including as described above in assessment and plan.

## 2018-06-06 ENCOUNTER — Other Ambulatory Visit: Payer: Self-pay

## 2018-06-06 ENCOUNTER — Encounter: Payer: Self-pay | Admitting: Endocrinology

## 2018-06-06 ENCOUNTER — Ambulatory Visit: Payer: BLUE CROSS/BLUE SHIELD | Admitting: Endocrinology

## 2018-06-06 VITALS — BP 142/104 | HR 65 | Ht 66.0 in | Wt 198.0 lb

## 2018-06-06 DIAGNOSIS — R7989 Other specified abnormal findings of blood chemistry: Secondary | ICD-10-CM

## 2018-06-06 DIAGNOSIS — J309 Allergic rhinitis, unspecified: Secondary | ICD-10-CM | POA: Diagnosis not present

## 2018-06-06 LAB — FOLLICLE STIMULATING HORMONE: FSH: 1.5 m[IU]/mL (ref 1.4–18.1)

## 2018-06-06 LAB — BASIC METABOLIC PANEL
BUN: 16 mg/dL (ref 6–23)
CO2: 29 mEq/L (ref 19–32)
Calcium: 9.3 mg/dL (ref 8.4–10.5)
Chloride: 102 mEq/L (ref 96–112)
Creatinine, Ser: 1.03 mg/dL (ref 0.40–1.50)
GFR: 77.19 mL/min (ref >60.00)
Glucose, Bld: 93 mg/dL (ref 70–99)
Potassium: 4.4 mEq/L (ref 3.5–5.1)
Sodium: 139 mEq/L (ref 135–145)

## 2018-06-06 LAB — LUTEINIZING HORMONE: LH: 1.44 m[IU]/mL — AB (ref 1.50–9.30)

## 2018-06-06 NOTE — Patient Instructions (Addendum)
Blood tests are requested for you today.  We'll let you know about the results.  Based on the results, we'll probably need to check the MRI.  you will receive a phone call, about a day and time for an appointment. Testosterone treatment has risks, including increased or decreased fertility (depending on the type of treatment), hair loss, prostate cancer, benign prostate enlargement, blood clots, liver problems, lower hdl ("good cholesterol"), polycythemia (opposite of anemia), sleep apnea, and behavior changes.  Weight loss helps the testosterone, also.

## 2018-06-06 NOTE — Progress Notes (Signed)
Subjective:    Patient ID: Don Cochran, male    DOB: [birth year], 48 y.o.   MRN: [id number]  HPI Pt is referred by Dr Katrinka Blazing, for hypogonadism.  Pt reports he had puberty at the normal age.  He has 1 biological child.  He and wife use no contraception.  He says he has never taken illicit androgens.  He has never been on any prescribed medication for hypogonadism.  He does not take antiandrogens or opioids.  He denies any h/o infertility, XRT, or genital infection.  He has never had surgery, or a serious injury to the head or genital area. He has no h/o sleep apnea or DVT.   He does not consume alcohol excessively.  He has slight weakness of all 4 extremities, and assoc pain.   Past Medical History:  Diagnosis Date  . Allergic rhinitis     No past surgical history on file.  Social History   Socioeconomic History  . Marital status: Married    Spouse name: Not on file  . Number of children: Not on file  . Years of education: Not on file  . Highest education level: Not on file  Occupational History  . Not on file  Social Needs  . Financial resource strain: Not on file  . Food insecurity:    Worry: Not on file    Inability: Not on file  . Transportation needs:    Medical: Not on file    Non-medical: Not on file  Tobacco Use  . Smoking status: Never Smoker  . Smokeless tobacco: Never Used  Substance and Sexual Activity  . Alcohol use: Not on file  . Drug use: Not on file  . Sexual activity: Not on file  Lifestyle  . Physical activity:    Days per week: Not on file    Minutes per session: Not on file  . Stress: Not on file  Relationships  . Social connections:    Talks on phone: Not on file    Gets together: Not on file    Attends religious service: Not on file    Active member of club or organization: Not on file    Attends meetings of clubs or organizations: Not on file    Relationship status: Not on file  . Intimate partner violence:    Fear of current or ex partner:  Not on file    Emotionally abused: Not on file    Physically abused: Not on file    Forced sexual activity: Not on file  Other Topics Concern  . Not on file  Social History Narrative  . Not on file    Current Outpatient Medications on File Prior to Visit  Medication Sig Dispense Refill  . cyclobenzaprine (FLEXERIL) 10 MG tablet Take 1 tablet (10 mg total) by mouth at bedtime. 30 tablet 0  . desvenlafaxine (PRISTIQ) 50 MG 24 hr tablet     . diclofenac (VOLTAREN) 75 MG EC tablet TAKE 1 TABLET (75 MG TOTAL) BY MOUTH 2 (TWO) TIMES DAILY. 50 tablet 0  . gabapentin (NEURONTIN) 100 MG capsule TAKE 2 CAPSULES BY MOUTH AT BEDTIME 180 capsule 2  . ibuprofen (ADVIL,MOTRIN) 200 MG tablet Take 200 mg by mouth every 6 (six) hours as needed.    . loratadine (CLARITIN) 10 MG tablet Take 10 mg by mouth daily.    . rosuvastatin (CRESTOR) 10 MG tablet Take 10 mg by mouth daily.    . valACYclovir (VALTREX) 500 MG tablet Take 500  mg by mouth 2 (two) times daily.    . Diclofenac Sodium (PENNSAID) 2 % SOLN Place 2 g onto the skin 2 (two) times daily. (Patient not taking: Reported on 06/06/2018) 112 g 3   No current facility-administered medications on file prior to visit.     No Known Allergies  Family History  Problem Relation Age of Onset  . Other Neg Hx        low testosterone    BP (!) 142/104 (BP Location: Right Arm, Patient Position: Sitting, Cuff Size: Normal)   Pulse 65   Ht 5\' 6"  (1.676 m)   Wt 198 lb (89.8 kg)   SpO2 97%   BMI 31.96 kg/m   Review of Systems denies numbness, erectile dysfunction, decreased urinary stream, gynecomastia, fever, headache, easy bruising, sob, rash, blurry vision, rhinorrhea, and chest pain.  Depression is somewhat well-controlled.  He has weight gain and decreased libido.     Objective:   Physical Exam VS: see vs page GEN: no distress HEAD: head: no deformity eyes: no periorbital swelling, no proptosis external nose and ears are normal mouth: no  lesion seen NECK: supple, thyroid is not enlarged CHEST WALL: no deformity LUNGS: clear to auscultation BREASTS:  No gynecomastia CV: reg rate and rhythm, no murmur ABD: abdomen is soft, nontender.  no hepatosplenomegaly.  not distended.  no hernia GENITALIA:  Normal male.   MUSCULOSKELETAL: muscle bulk and strength are grossly normal.  no obvious joint swelling.  gait is normal and steady.  EXTEMITIES: No leg edema PULSES: no carotid bruit NEURO:  cn 2-12 grossly intact.   readily moves all 4's.  sensation is intact to touch on all 4's SKIN:  Normal texture and temperature.  No rash or suspicious lesion is visible.  Normal make hair distribution.  NODES:  None palpable at the neck PSYCH: alert, well-oriented.  Does not appear anxious nor depressed.   Lab Results  Component Value Date   TESTOSTERONE 137 (L) 04/12/2018   I have reviewed outside records, and summarized: Pt was noted to have low testosterone, and referred here.  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  We'll let you know about the results.  Based on the results, we'll probably need to check the MRI.  you will receive a phone call, about a day and time for an appointment. Testosterone treatment has risks, including increased or decreased fertility (depending on the type of treatment), hair loss, prostate cancer, benign prostate enlargement, blood clots, liver problems, lower hdl ("good cholesterol"), polycythemia (opposite of anemia), sleep apnea, and behavior changes.  Weight loss helps the testosterone, also.

## 2018-06-07 LAB — PROLACTIN: Prolactin: 10 ng/mL (ref 2.0–18.0)

## 2018-06-09 ENCOUNTER — Encounter: Payer: Self-pay | Admitting: Endocrinology

## 2018-06-09 ENCOUNTER — Telehealth: Payer: Self-pay | Admitting: Endocrinology

## 2018-06-09 DIAGNOSIS — J309 Allergic rhinitis, unspecified: Secondary | ICD-10-CM | POA: Insufficient documentation

## 2018-06-09 NOTE — Telephone Encounter (Signed)
please contact patient: When you were here, BP was high.  Please recheck soon with your PCP

## 2018-06-11 NOTE — Telephone Encounter (Signed)
Called pt and informed of Dr. George Hugh request. Verbalized acceptance and understanding.

## 2018-06-13 DIAGNOSIS — F900 Attention-deficit hyperactivity disorder, predominantly inattentive type: Secondary | ICD-10-CM | POA: Diagnosis not present

## 2018-06-13 DIAGNOSIS — F3181 Bipolar II disorder: Secondary | ICD-10-CM | POA: Diagnosis not present

## 2018-07-24 DIAGNOSIS — F3181 Bipolar II disorder: Secondary | ICD-10-CM | POA: Diagnosis not present

## 2018-08-21 ENCOUNTER — Telehealth: Payer: Self-pay

## 2018-08-21 DIAGNOSIS — E230 Hypopituitarism: Secondary | ICD-10-CM | POA: Insufficient documentation

## 2018-08-21 NOTE — Addendum Note (Signed)
Addended by: Romero Belling on: 08/21/2018 09:05 AM   Modules accepted: Orders

## 2018-08-21 NOTE — Telephone Encounter (Signed)
Dr. Everardo All has placed order. Please call pt to schedule MRI

## 2018-08-21 NOTE — Telephone Encounter (Signed)
I have placed the order

## 2018-08-21 NOTE — Telephone Encounter (Signed)
Pt calling to inquire about his MRI and if it had been scheduled. Not seeing an order. Message routed to Dr. Everardo All asking him to place an order.

## 2018-08-21 NOTE — Telephone Encounter (Signed)
Referral has been sent to GI and they will call patient to schedule

## 2018-09-03 DIAGNOSIS — F319 Bipolar disorder, unspecified: Secondary | ICD-10-CM | POA: Diagnosis not present

## 2018-09-10 DIAGNOSIS — E7800 Pure hypercholesterolemia, unspecified: Secondary | ICD-10-CM | POA: Diagnosis not present

## 2018-09-10 DIAGNOSIS — F429 Obsessive-compulsive disorder, unspecified: Secondary | ICD-10-CM | POA: Diagnosis not present

## 2018-09-11 ENCOUNTER — Other Ambulatory Visit: Payer: Self-pay | Admitting: Endocrinology

## 2018-09-11 ENCOUNTER — Telehealth: Payer: Self-pay | Admitting: Endocrinology

## 2018-09-11 DIAGNOSIS — F3181 Bipolar II disorder: Secondary | ICD-10-CM | POA: Diagnosis not present

## 2018-09-11 NOTE — Telephone Encounter (Signed)
I called peer to peer. They approved.  auth # is [id number].  Valid 09/11/18-03/09/19

## 2018-09-12 ENCOUNTER — Other Ambulatory Visit: Payer: Self-pay | Admitting: Endocrinology

## 2018-09-12 ENCOUNTER — Ambulatory Visit
Admission: RE | Admit: 2018-09-12 | Discharge: 2018-09-12 | Disposition: A | Discharge status: Home / Self Care | Payer: BC Managed Care – PPO | Source: Ambulatory Visit | Attending: Endocrinology | Admitting: Endocrinology

## 2018-09-12 ENCOUNTER — Other Ambulatory Visit: Payer: Self-pay

## 2018-09-12 DIAGNOSIS — E230 Hypopituitarism: Secondary | ICD-10-CM

## 2018-09-12 DIAGNOSIS — E291 Testicular hypofunction: Secondary | ICD-10-CM | POA: Diagnosis not present

## 2018-09-12 DIAGNOSIS — F329 Major depressive disorder, single episode, unspecified: Secondary | ICD-10-CM | POA: Diagnosis not present

## 2018-09-12 MED ORDER — CLOMIPHENE CITRATE 50 MG PO TABS: ORAL_TABLET | ORAL | 5 refills | Status: DC

## 2018-09-12 MED ORDER — GADOBENATE DIMEGLUMINE 529 MG/ML IV SOLN
10.0000 mL | Freq: Once | INTRAVENOUS | Status: AC | PRN
  Administered 2018-09-12: 10:00:00 10 mL via INTRAVENOUS

## 2018-09-12 NOTE — Telephone Encounter (Signed)
Spoke with Mariam at GI and gave her auth # for this patient

## 2018-09-13 ENCOUNTER — Telehealth: Payer: Self-pay

## 2018-09-13 NOTE — Telephone Encounter (Signed)
Received notification from [facility], Josem Kaufmann #[id number], DOS 09/12/2018, Outpatient, MRI of Brain with and without contrast has been approved (Certified in Total) for previously mentioned DOS. PA is only valid for DOS 09/12/18. If pt cancels or reschedules, a new auth will need to be obtained. Contact [phone]. Documents have been labeled and sent to HIM for scanning purposes and for our future reference.

## 2018-09-18 ENCOUNTER — Encounter: Payer: Self-pay | Admitting: Endocrinology

## 2018-09-25 DIAGNOSIS — F319 Bipolar disorder, unspecified: Secondary | ICD-10-CM | POA: Diagnosis not present

## 2018-11-06 DIAGNOSIS — F3181 Bipolar II disorder: Secondary | ICD-10-CM | POA: Diagnosis not present

## 2018-12-12 ENCOUNTER — Other Ambulatory Visit: Payer: Self-pay

## 2018-12-12 DIAGNOSIS — Z20822 Contact with and (suspected) exposure to covid-19: Secondary | ICD-10-CM

## 2018-12-13 LAB — NOVEL CORONAVIRUS, NAA: SARS-CoV-2, NAA: NOT DETECTED

## 2019-01-23 ENCOUNTER — Other Ambulatory Visit: Payer: Self-pay

## 2019-01-23 DIAGNOSIS — Z20822 Contact with and (suspected) exposure to covid-19: Secondary | ICD-10-CM

## 2019-01-25 LAB — NOVEL CORONAVIRUS, NAA: SARS-CoV-2, NAA: NOT DETECTED

## 2019-02-04 DIAGNOSIS — F3181 Bipolar II disorder: Secondary | ICD-10-CM | POA: Diagnosis not present

## 2019-02-20 DIAGNOSIS — R6889 Other general symptoms and signs: Secondary | ICD-10-CM | POA: Diagnosis not present

## 2019-02-23 ENCOUNTER — Other Ambulatory Visit: Payer: Self-pay

## 2019-02-23 DIAGNOSIS — Z20822 Contact with and (suspected) exposure to covid-19: Secondary | ICD-10-CM

## 2019-02-25 LAB — NOVEL CORONAVIRUS, NAA: SARS-CoV-2, NAA: NOT DETECTED

## 2019-03-19 ENCOUNTER — Telehealth: Payer: Self-pay | Admitting: Endocrinology

## 2019-03-19 NOTE — Telephone Encounter (Signed)
Please advise 

## 2019-03-19 NOTE — Telephone Encounter (Signed)
Per Dr. Ellison, unable to refill Clomid without an appt. Routing this message to the front desk for scheduling purposes. 

## 2019-03-19 NOTE — Telephone Encounter (Signed)
1.  Please schedule f/u appt 2.  Then please refill x 1, pending that appt.  

## 2019-03-22 NOTE — Telephone Encounter (Signed)
Patient is scheduled for appointment on 04/18/19 at 8:45 a.m.

## 2019-03-25 ENCOUNTER — Other Ambulatory Visit: Payer: Self-pay

## 2019-03-25 DIAGNOSIS — E230 Hypopituitarism: Secondary | ICD-10-CM

## 2019-03-25 MED ORDER — CLOMIPHENE CITRATE 50 MG PO TABS: ORAL_TABLET | ORAL | 0 refills | Status: DC

## 2019-03-25 NOTE — Telephone Encounter (Signed)
clomiPHENE (CLOMID) 50 MG tablet 8 tablet 0 03/25/2019    Sig: 1/4 tab daily   Sent to pharmacy as: clomiPHENE (CLOMID) 50 MG tablet   E-Prescribing Status: Receipt confirmed by pharmacy (03/25/2019  8:23 AM EST)

## 2019-04-11 DIAGNOSIS — D225 Melanocytic nevi of trunk: Secondary | ICD-10-CM | POA: Diagnosis not present

## 2019-04-11 DIAGNOSIS — L821 Other seborrheic keratosis: Secondary | ICD-10-CM | POA: Diagnosis not present

## 2019-04-11 DIAGNOSIS — L309 Dermatitis, unspecified: Secondary | ICD-10-CM | POA: Diagnosis not present

## 2019-04-11 DIAGNOSIS — L578 Other skin changes due to chronic exposure to nonionizing radiation: Secondary | ICD-10-CM | POA: Diagnosis not present

## 2019-04-16 ENCOUNTER — Other Ambulatory Visit: Payer: Self-pay

## 2019-04-18 ENCOUNTER — Ambulatory Visit: Payer: BC Managed Care – PPO | Admitting: Endocrinology

## 2019-04-18 ENCOUNTER — Other Ambulatory Visit: Payer: Self-pay

## 2019-04-18 ENCOUNTER — Encounter: Payer: Self-pay | Admitting: Endocrinology

## 2019-04-18 VITALS — BP 144/80 | HR 82 | Ht 66.0 in | Wt 199.6 lb

## 2019-04-18 DIAGNOSIS — E230 Hypopituitarism: Secondary | ICD-10-CM

## 2019-04-18 DIAGNOSIS — R7989 Other specified abnormal findings of blood chemistry: Secondary | ICD-10-CM

## 2019-04-18 LAB — CBC WITH DIFFERENTIAL/PLATELET
Basophils Absolute: 0 10*3/uL (ref 0.0–0.1)
Basophils Relative: 0.6 % (ref 0.0–3.0)
Eosinophils Absolute: 0.1 10*3/uL (ref 0.0–0.7)
Eosinophils Relative: 1.9 % (ref 0.0–5.0)
HCT: 43.3 % (ref 39.0–52.0)
Hemoglobin: 14.3 g/dL (ref 13.0–17.0)
Lymphocytes Relative: 22.6 % (ref 12.0–46.0)
Lymphs Abs: 1.6 10*3/uL (ref 0.7–4.0)
MCHC: 33 g/dL (ref 30.0–36.0)
MCV: 88.6 fl (ref 78.0–100.0)
Monocytes Absolute: 0.5 10*3/uL (ref 0.1–1.0)
Monocytes Relative: 6.6 % (ref 3.0–12.0)
Neutro Abs: 4.7 10*3/uL (ref 1.4–7.7)
Neutrophils Relative %: 68.3 % (ref 43.0–77.0)
Platelets: 236 10*3/uL (ref 150.0–400.0)
RBC: 4.89 Mil/uL (ref 4.22–5.81)
RDW: 13.4 % (ref 11.5–15.5)
WBC: 6.9 10*3/uL (ref 4.0–10.5)

## 2019-04-18 NOTE — Progress Notes (Signed)
Subjective:    Patient ID: Don Cochran, male    DOB: 1971/01/14, 49 y.o.   MRN: [id number]  HPI Pt returns for f/u of idiopathic central hypogonadism: dx'ed 2020; he has 1 biological child.  He and wife use no contraception; he was rx'ed clomid: pituitary MRI was normal). Since on clomid, arthralgias as less now.  pt states low libido persists, however.   Past Medical History:  Diagnosis Date  . Allergic rhinitis     No past surgical history on file.  Social History   Socioeconomic History  . Marital status: Married    Spouse name: Not on file  . Number of children: Not on file  . Years of education: Not on file  . Highest education level: Not on file  Occupational History  . Not on file  Tobacco Use  . Smoking status: Never Smoker  . Smokeless tobacco: Never Used  Substance and Sexual Activity  . Alcohol use: Not on file  . Drug use: Not on file  . Sexual activity: Not on file  Other Topics Concern  . Not on file  Social History Narrative  . Not on file   Social Determinants of Health   Financial Resource Strain:   . Difficulty of Paying Living Expenses: Not on file  Food Insecurity:   . Worried About Charity fundraiser in the Last Year: Not on file  . Ran Out of Food in the Last Year: Not on file  Transportation Needs:   . Lack of Transportation (Medical): Not on file  . Lack of Transportation (Non-Medical): Not on file  Physical Activity:   . Days of Exercise per Week: Not on file  . Minutes of Exercise per Session: Not on file  Stress:   . Feeling of Stress : Not on file  Social Connections:   . Frequency of Communication with Friends and Family: Not on file  . Frequency of Social Gatherings with Friends and Family: Not on file  . Attends Religious Services: Not on file  . Active Member of Clubs or Organizations: Not on file  . Attends Archivist Meetings: Not on file  . Marital Status: Not on file  Intimate Partner Violence:   . Fear of  Current or Ex-Partner: Not on file  . Emotionally Abused: Not on file  . Physically Abused: Not on file  . Sexually Abused: Not on file    Current Outpatient Medications on File Prior to Visit  Medication Sig Dispense Refill  . cyclobenzaprine (FLEXERIL) 10 MG tablet Take 1 tablet (10 mg total) by mouth at bedtime. 30 tablet 0  . desvenlafaxine (PRISTIQ) 50 MG 24 hr tablet     . diclofenac (VOLTAREN) 75 MG EC tablet TAKE 1 TABLET (75 MG TOTAL) BY MOUTH 2 (TWO) TIMES DAILY. 50 tablet 0  . Diclofenac Sodium (PENNSAID) 2 % SOLN Place 2 g onto the skin 2 (two) times daily. 112 g 3  . gabapentin (NEURONTIN) 100 MG capsule TAKE 2 CAPSULES BY MOUTH AT BEDTIME 180 capsule 2  . ibuprofen (ADVIL,MOTRIN) 200 MG tablet Take 200 mg by mouth every 6 (six) hours as needed.    . loratadine (CLARITIN) 10 MG tablet Take 10 mg by mouth daily.    . rosuvastatin (CRESTOR) 10 MG tablet Take 10 mg by mouth daily.    . valACYclovir (VALTREX) 500 MG tablet Take 500 mg by mouth 2 (two) times daily.     No current facility-administered medications on file prior  to visit.    No Known Allergies  Family History  Problem Relation Age of Onset  . Other Neg Hx        low testosterone    BP (!) 144/80 (BP Location: Right Arm, Patient Position: Sitting, Cuff Size: Normal)   Pulse 82   Ht 5\' 6"  (1.676 m)   Wt 199 lb 9.6 oz (90.5 kg)   SpO2 95%   BMI 32.22 kg/m    Review of Systems Denies decreased urinary stream.      Objective:   Physical Exam VITAL SIGNS:  See vs page GENERAL: no distress Ext: no leg edema.    Lab Results  Component Value Date   TESTOSTERONE 657 04/18/2019       Assessment & Plan:  HTN: is noted today Hypogonadism: well-controlled  Patient Instructions  Your blood pressure is high today.  Please see your primary care provider soon, to have it rechecked Blood tests are requested for you today.  We'll let you know about the results.  Testosterone treatment has risks,  including increased or decreased fertility (depending on the type of treatment), hair loss, prostate cancer, benign prostate enlargement, blood clots, liver problems, lower hdl ("good cholesterol"), polycythemia (opposite of anemia), sleep apnea, and behavior changes.   Weight loss helps the testosterone, also.   Please come back for a follow-up appointment in 1 year.

## 2019-04-18 NOTE — Patient Instructions (Addendum)
Your blood pressure is high today.  Please see your primary care provider soon, to have it rechecked Blood tests are requested for you today.  We'll let you know about the results.  Testosterone treatment has risks, including increased or decreased fertility (depending on the type of treatment), hair loss, prostate cancer, benign prostate enlargement, blood clots, liver problems, lower hdl ("good cholesterol"), polycythemia (opposite of anemia), sleep apnea, and behavior changes.   Weight loss helps the testosterone, also.   Please come back for a follow-up appointment in 1 year.

## 2019-04-20 ENCOUNTER — Other Ambulatory Visit: Payer: Self-pay | Admitting: Endocrinology

## 2019-04-20 DIAGNOSIS — E230 Hypopituitarism: Secondary | ICD-10-CM

## 2019-04-22 LAB — TESTOSTERONE,FREE AND TOTAL
Testosterone, Free: 10.9 pg/mL (ref 6.8–21.5)
Testosterone: 657 ng/dL (ref 264–916)

## 2019-04-23 ENCOUNTER — Other Ambulatory Visit: Payer: Self-pay | Admitting: Cardiology

## 2019-04-23 DIAGNOSIS — Z20822 Contact with and (suspected) exposure to covid-19: Secondary | ICD-10-CM | POA: Diagnosis not present

## 2019-04-24 LAB — NOVEL CORONAVIRUS, NAA: SARS-CoV-2, NAA: NOT DETECTED

## 2019-05-13 DIAGNOSIS — F3181 Bipolar II disorder: Secondary | ICD-10-CM | POA: Diagnosis not present

## 2019-06-17 ENCOUNTER — Other Ambulatory Visit: Payer: Self-pay | Admitting: *Deleted

## 2019-06-17 ENCOUNTER — Other Ambulatory Visit: Payer: Self-pay | Admitting: Cardiology

## 2019-06-17 DIAGNOSIS — Z20822 Contact with and (suspected) exposure to covid-19: Secondary | ICD-10-CM | POA: Diagnosis not present

## 2019-06-18 LAB — NOVEL CORONAVIRUS, NAA: SARS-CoV-2, NAA: NOT DETECTED

## 2019-07-01 ENCOUNTER — Other Ambulatory Visit: Payer: Self-pay | Admitting: Endocrinology

## 2019-07-01 DIAGNOSIS — E230 Hypopituitarism: Secondary | ICD-10-CM

## 2019-07-17 DIAGNOSIS — Z03818 Encounter for observation for suspected exposure to other biological agents ruled out: Secondary | ICD-10-CM | POA: Diagnosis not present

## 2019-07-17 DIAGNOSIS — E669 Obesity, unspecified: Secondary | ICD-10-CM | POA: Diagnosis not present

## 2019-07-17 DIAGNOSIS — E119 Type 2 diabetes mellitus without complications: Secondary | ICD-10-CM | POA: Diagnosis not present

## 2019-07-17 DIAGNOSIS — Z20828 Contact with and (suspected) exposure to other viral communicable diseases: Secondary | ICD-10-CM | POA: Diagnosis not present

## 2019-07-17 DIAGNOSIS — Z7189 Other specified counseling: Secondary | ICD-10-CM | POA: Diagnosis not present

## 2019-07-20 ENCOUNTER — Other Ambulatory Visit: Payer: Self-pay | Admitting: Endocrinology

## 2019-07-20 DIAGNOSIS — E230 Hypopituitarism: Secondary | ICD-10-CM

## 2019-07-29 DIAGNOSIS — F902 Attention-deficit hyperactivity disorder, combined type: Secondary | ICD-10-CM | POA: Diagnosis not present

## 2019-07-29 DIAGNOSIS — F411 Generalized anxiety disorder: Secondary | ICD-10-CM | POA: Diagnosis not present

## 2019-07-29 DIAGNOSIS — F3181 Bipolar II disorder: Secondary | ICD-10-CM | POA: Diagnosis not present

## 2019-08-10 IMAGING — MR MRI HEAD WITHOUT AND WITH CONTRAST
13 of 19 series · 34 of 48 positions shown · IV contrast (10 ml multihance)
Comparison: None.

CLINICAL DATA: Low testosterone.  Fatigue.  Depression.

EXAM:
MRI HEAD WITHOUT AND WITH CONTRAST
TECHNIQUE: Multiplanar, multiecho pulse sequences of the brain and surrounding
structures were obtained without and with intravenous contrast.
CONTRAST:  10mL MULTIHANCE GADOBENATE DIMEGLUMINE 529 MG/ML IV SOLN

[Series 2: T1 · sagittal · 5.0mm · 0.47mm/px · 3 of 19 slices shown]
[im 1/19]
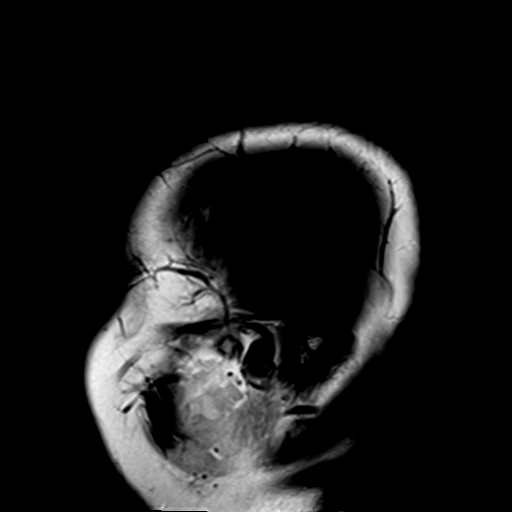
[im 10/19]
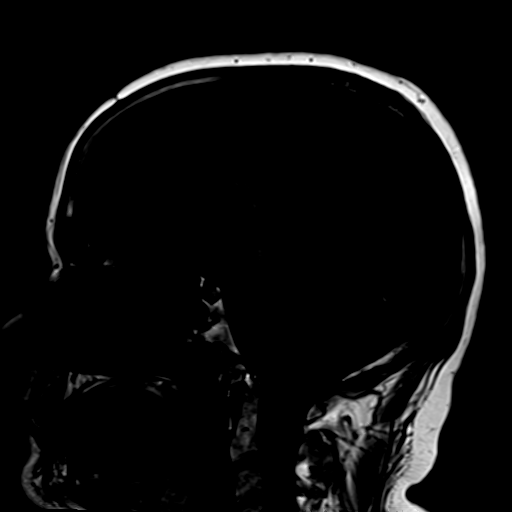
[im 19/19]
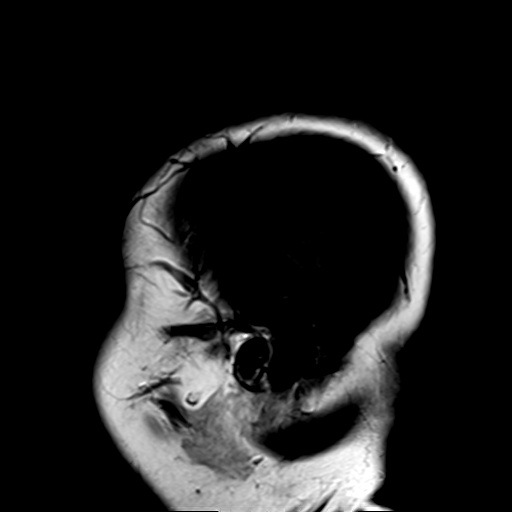

[Series 3: DWI · axial · 3.0mm · 1.88mm/px · z∈[-8,+144]mm · 11 of 104 slices shown]
[im 1/104]
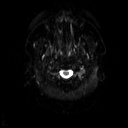
[im 11/104]
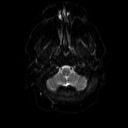
[im 21/104]
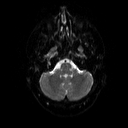
[im 31/104]
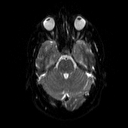
[im 42/104]
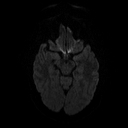
[im 52/104]
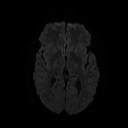
[im 62/104]
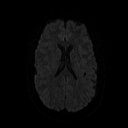
[im 73/104]
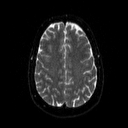
[im 83/104]
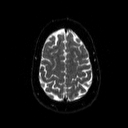
[im 93/104]
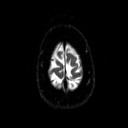
[im 104/104]
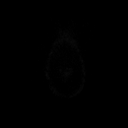

[Series 4: dwi_adc · axial · 3.0mm · 1.88mm/px · z∈[-8,+144]mm · 5 of 52 slices shown]
[im 1/52]
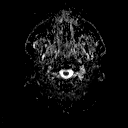
[im 13/52]
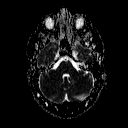
[im 26/52]
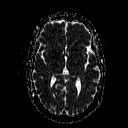
[im 39/52]
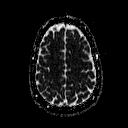
[im 52/52]
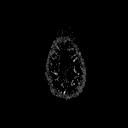

[Series 5: T2 · axial · 5.0mm · 0.38mm/px · z∈[-6,+149]mm · 3 of 25 slices shown]
[im 1/25]
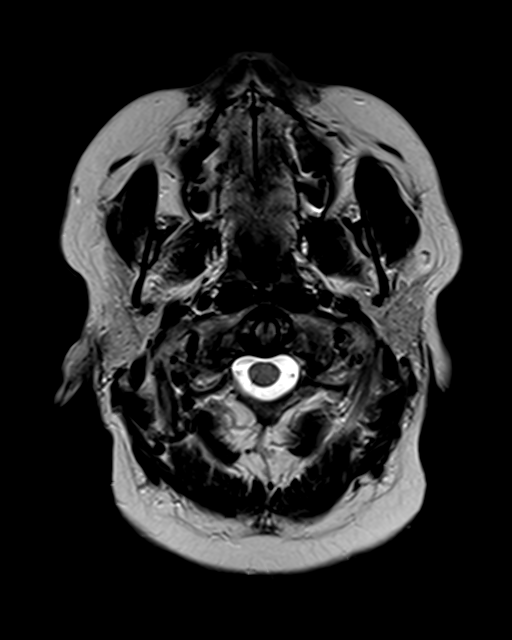
[im 13/25]
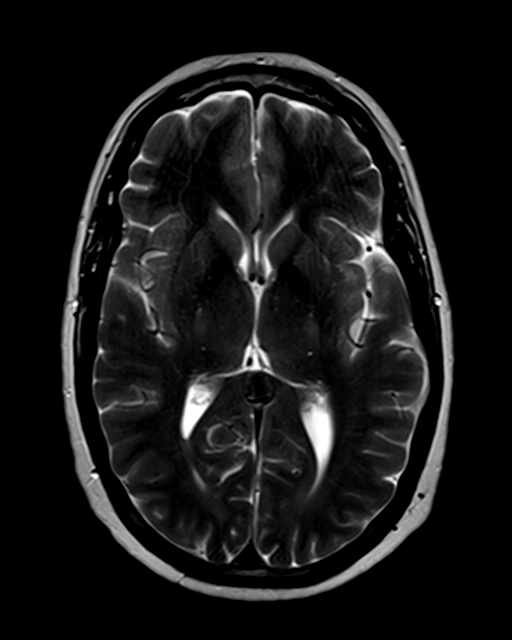
[im 25/25]
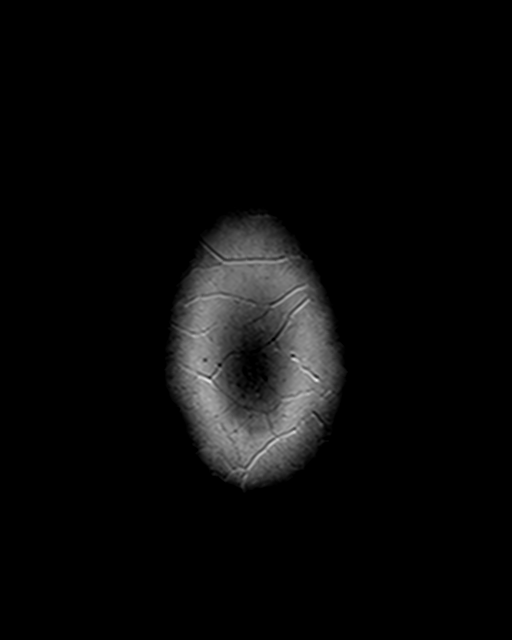

[Series 6: FLAIR · axial · 3.0mm · 0.47mm/px · z∈[+5,+138]mm · 3 of 30 slices shown]
[im 1/30]
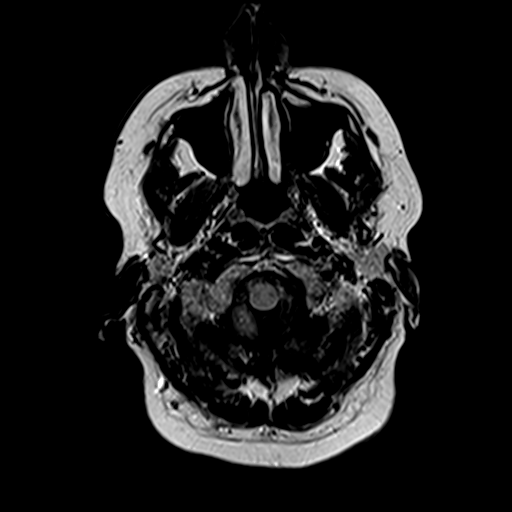
[im 15/30]
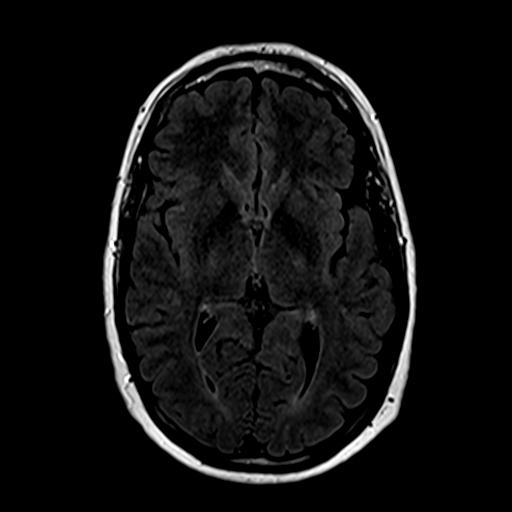
[im 30/30]
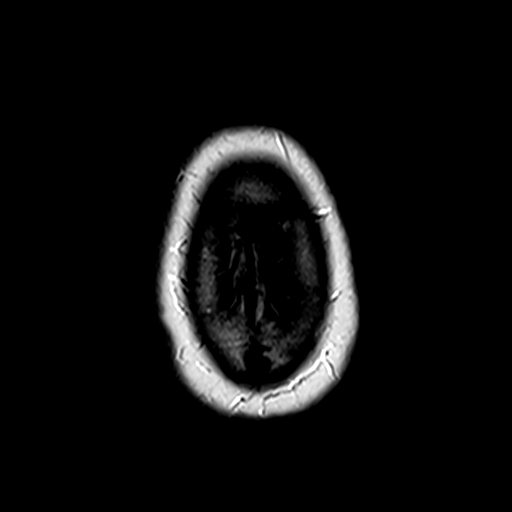

[Series 7: axial grad (blood) · axial · 5.0mm · 0.45mm/px · z∈[-3,+145]mm · 2 of 24 slices shown]
[im 1/24]
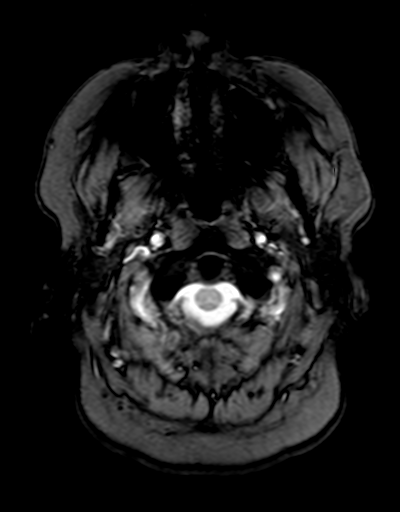
[im 24/24]
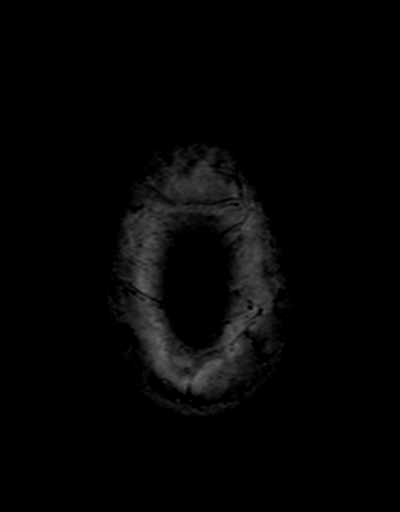

[Series 8: sag 3mm · sagittal · 3.0mm · 0.33mm/px · 1 of 11 slices shown]
[im 1/11]
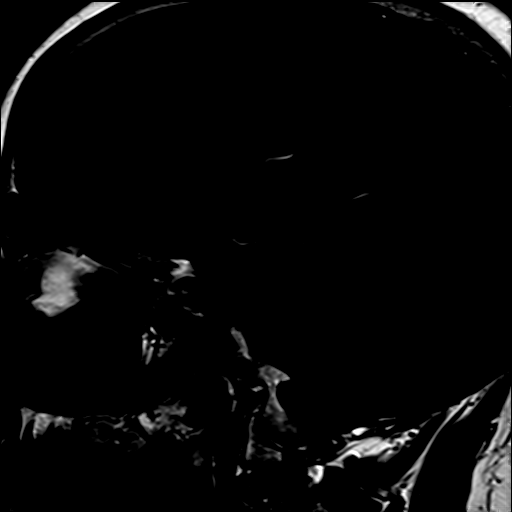

[Series 9: cor 3mm · coronal · 3.0mm · 0.33mm/px · 1 of 13 slices shown]
[im 1/13]
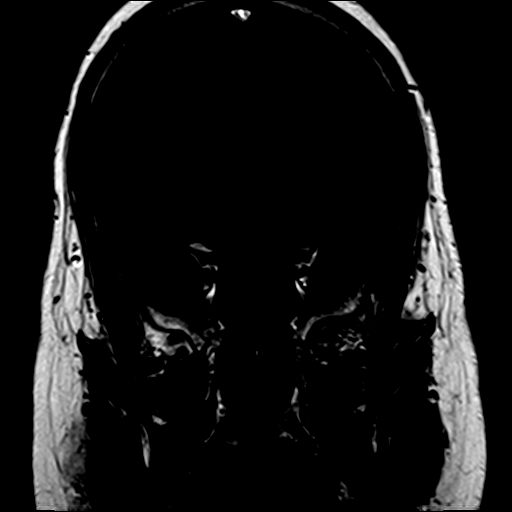

[Series 10: pre cor dynamic · coronal · non-contrast · 3.0mm · 0.35mm/px · 1 of 10 slices shown]
[im 1/10]
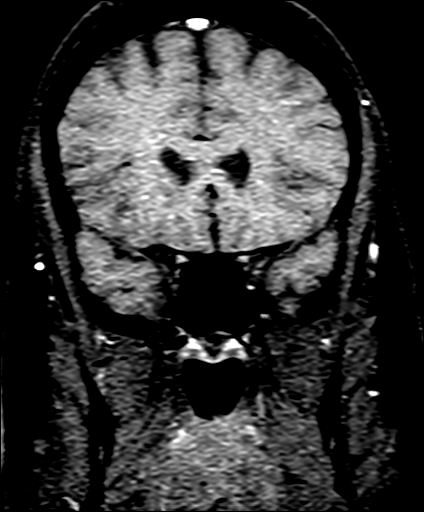

[Series 11: post fs cor · coronal · 3.0mm · 0.35mm/px · 1 of 10 slices shown (1 of 4)]
[im 1/10]
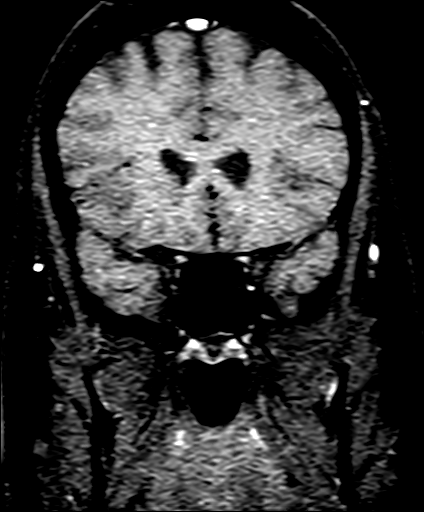

[Series 12: post fs cor · coronal · 3.0mm · 0.35mm/px · 1 of 10 slices shown (2 of 4)]
[im 1/10]
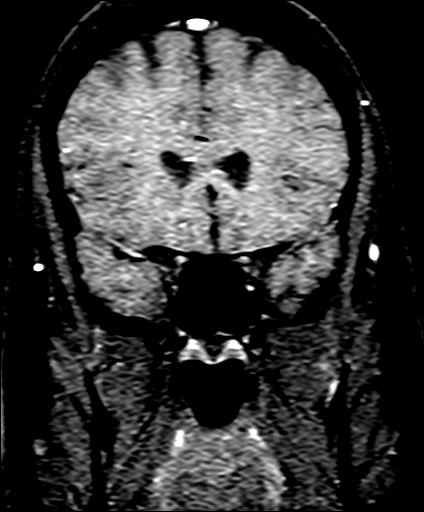

[Series 13: post fs cor · coronal · 3.0mm · 0.35mm/px · 1 of 10 slices shown (3 of 4)]
[im 1/10]
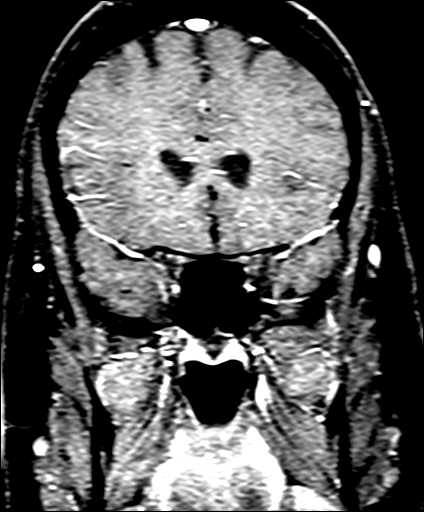

[Series 14: post fs cor · coronal · 3.0mm · 0.35mm/px · 1 of 10 slices shown (4 of 4)]
[im 1/10]
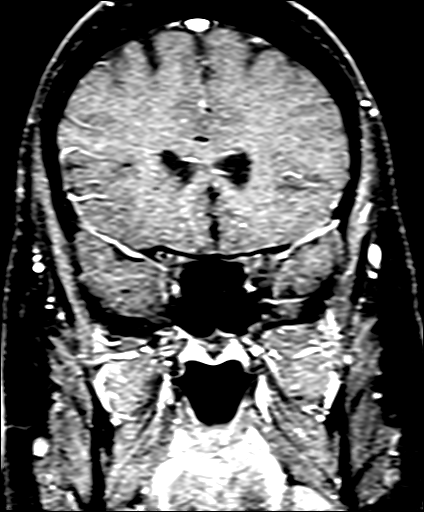

[34 of 48 positions shown; findings below may reference images not displayed]

FINDINGS: Brain: The brain has a normal appearance without evidence of
malformation, atrophy, old or acute small or large vessel
infarction, mass lesion, hemorrhage, hydrocephalus or extra-axial
collection. The pituitary gland is normal in size measuring 13 x 9 x
7 mm. After contrast administration, the gland enhances in a normal
and homogeneous fashion. No macro adenoma. No visible micro adenoma.
Normal infundibulum and hypothalamus.

Vascular: Major vessels at the base of the brain show flow. Venous
sinuses appear patent.

Skull and upper cervical spine: Normal.

Sinuses/Orbits: Clear/normal.

Other: None significant.
IMPRESSION: Normal MRI of the brain.

Normal MRI of the pituitary gland, infundibulum and hypothalamus.

## 2019-08-21 DIAGNOSIS — F429 Obsessive-compulsive disorder, unspecified: Secondary | ICD-10-CM | POA: Diagnosis not present

## 2019-08-21 DIAGNOSIS — B009 Herpesviral infection, unspecified: Secondary | ICD-10-CM | POA: Diagnosis not present

## 2019-08-21 DIAGNOSIS — E7800 Pure hypercholesterolemia, unspecified: Secondary | ICD-10-CM | POA: Diagnosis not present

## 2019-09-02 ENCOUNTER — Other Ambulatory Visit: Payer: Self-pay | Admitting: Endocrinology

## 2019-09-02 DIAGNOSIS — E230 Hypopituitarism: Secondary | ICD-10-CM

## 2019-09-09 DIAGNOSIS — F902 Attention-deficit hyperactivity disorder, combined type: Secondary | ICD-10-CM | POA: Diagnosis not present

## 2019-09-09 DIAGNOSIS — F3181 Bipolar II disorder: Secondary | ICD-10-CM | POA: Diagnosis not present

## 2019-09-09 DIAGNOSIS — F411 Generalized anxiety disorder: Secondary | ICD-10-CM | POA: Diagnosis not present

## 2019-09-29 ENCOUNTER — Other Ambulatory Visit: Payer: Self-pay | Admitting: Endocrinology

## 2019-09-29 DIAGNOSIS — E230 Hypopituitarism: Secondary | ICD-10-CM

## 2019-11-05 ENCOUNTER — Other Ambulatory Visit: Payer: Self-pay

## 2019-11-05 ENCOUNTER — Telehealth: Payer: Self-pay | Admitting: Endocrinology

## 2019-11-05 DIAGNOSIS — E230 Hypopituitarism: Secondary | ICD-10-CM

## 2019-11-05 MED ORDER — CLOMIPHENE CITRATE 50 MG PO TABS: ORAL_TABLET | ORAL | 0 refills | Status: DC

## 2019-11-05 NOTE — Progress Notes (Signed)
error 

## 2019-11-05 NOTE — Telephone Encounter (Signed)
Medication Refill Request  Did you call your pharmacy and request this refill first? Yes  . If patient has not contacted pharmacy first, instruct them to do so for future refills.  . Remind them that contacting the pharmacy for their refill is the quickest method to get the refill.  . Refill policy also stated that it will take anywhere between 24-72 hours to receive the refill.    Name of medication? clomiphene  Is this a 90 day supply? yes  Name and location of pharmacy?   (one time use only) [facility] [address] [place], [place] [address] Phone: [phone]

## 2019-11-05 NOTE — Telephone Encounter (Signed)
Outpatient Medication Detail   Disp Refills Start End   clomiPHENE (CLOMID) 50 MG tablet 8 tablet 0 11/05/2019    Sig: TAKE 1/4 TABLET BY MOUTH DAILY   Sent to pharmacy as: clomiPHENE (CLOMID) 50 MG tablet   E-Prescribing Status: Receipt confirmed by pharmacy (11/05/2019 11:51 AM EDT)

## 2019-11-19 ENCOUNTER — Other Ambulatory Visit: Payer: Self-pay | Admitting: Endocrinology

## 2019-11-19 DIAGNOSIS — E230 Hypopituitarism: Secondary | ICD-10-CM

## 2019-12-17 ENCOUNTER — Telehealth: Payer: Self-pay | Admitting: Endocrinology

## 2019-12-17 NOTE — Telephone Encounter (Signed)
Please move up next appt to next avail °

## 2019-12-17 NOTE — Telephone Encounter (Signed)
Outpatient Medication Detail   Disp Refills Start End   clomiPHENE (CLOMID) 50 MG tablet 24 tablet 2 11/19/2019    Sig: TAKE 1/4 TABLET BY MOUTH DAILY   Sent to pharmacy as: clomiPHENE (CLOMID) 50 MG tablet   E-Prescribing Status: Receipt confirmed by pharmacy (11/19/2019 10:08 AM EDT)    Don Cochran pt to further clarify his concern. States above medication he believes is causing arthralgia as well as upper back pain. Uncertain if dosage should be altered. Also wondering if labs need obtained. Advised I will forward this information to Dr. Everardo All for further advice.

## 2019-12-17 NOTE — Telephone Encounter (Signed)
Next Appt With Internal Medicine Romero Belling, MD) 12/18/2019 at 3:15 PM

## 2019-12-17 NOTE — Telephone Encounter (Signed)
Patient requests to be called at ph# [phone] re: Testosterone dosage change/evaluation

## 2019-12-18 ENCOUNTER — Ambulatory Visit (INDEPENDENT_AMBULATORY_CARE_PROVIDER_SITE_OTHER): Payer: BC Managed Care – PPO | Admitting: Endocrinology

## 2019-12-18 ENCOUNTER — Encounter: Payer: Self-pay | Admitting: Endocrinology

## 2019-12-18 ENCOUNTER — Other Ambulatory Visit: Payer: Self-pay

## 2019-12-18 VITALS — BP 138/82 | HR 76 | Ht 66.0 in | Wt 199.0 lb

## 2019-12-18 DIAGNOSIS — R7989 Other specified abnormal findings of blood chemistry: Secondary | ICD-10-CM

## 2019-12-18 DIAGNOSIS — R400 Somnolence: Secondary | ICD-10-CM | POA: Diagnosis not present

## 2019-12-18 DIAGNOSIS — R252 Cramp and spasm: Secondary | ICD-10-CM | POA: Diagnosis not present

## 2019-12-18 DIAGNOSIS — E230 Hypopituitarism: Secondary | ICD-10-CM | POA: Diagnosis not present

## 2019-12-18 LAB — BASIC METABOLIC PANEL
BUN: 19 mg/dL (ref 6–23)
CO2: 30 mEq/L (ref 19–32)
Calcium: 8.5 mg/dL (ref 8.4–10.5)
Chloride: 102 mEq/L (ref 96–112)
Creatinine, Ser: 1.23 mg/dL (ref 0.40–1.50)
GFR: 62.49 mL/min (ref >60.00)
Glucose, Bld: 118 mg/dL — ABNORMAL HIGH (ref 70–99)
Potassium: 3.7 mEq/L (ref 3.5–5.1)
Sodium: 139 mEq/L (ref 135–145)

## 2019-12-18 LAB — CBC WITH DIFFERENTIAL/PLATELET
Basophils Absolute: 0 10*3/uL (ref 0.0–0.1)
Basophils Relative: 0.6 % (ref 0.0–3.0)
Eosinophils Absolute: 0.2 10*3/uL (ref 0.0–0.7)
Eosinophils Relative: 2.1 % (ref 0.0–5.0)
HCT: 41 % (ref 39.0–52.0)
Hemoglobin: 13.7 g/dL (ref 13.0–17.0)
Lymphocytes Relative: 26.1 % (ref 12.0–46.0)
Lymphs Abs: 1.8 10*3/uL (ref 0.7–4.0)
MCHC: 33.3 g/dL (ref 30.0–36.0)
MCV: 89.8 fl (ref 78.0–100.0)
Monocytes Absolute: 0.4 10*3/uL (ref 0.1–1.0)
Monocytes Relative: 6.3 % (ref 3.0–12.0)
Neutro Abs: 4.6 10*3/uL (ref 1.4–7.7)
Neutrophils Relative %: 64.9 % (ref 43.0–77.0)
Platelets: 228 10*3/uL (ref 150.0–400.0)
RBC: 4.57 Mil/uL (ref 4.22–5.81)
RDW: 13.7 % (ref 11.5–15.5)
WBC: 7 10*3/uL (ref 4.0–10.5)

## 2019-12-18 NOTE — Progress Notes (Signed)
Subjective:    Patient ID: Don Cochran, male    DOB: 1970-12-10, 49 y.o.   MRN: [id number]  HPI Pt returns for f/u of idiopathic central hypogonadism: dx'ed 2020; he has 1 biological child.  He and wife use no contraception; he was rx'ed clomid: pituitary MRI was normal).  Main symptoms are muscle cramps, daytime drowsiness, and myalgias.   Past Medical History:  Diagnosis Date  . Allergic rhinitis     No past surgical history on file.  Social History   Socioeconomic History  . Marital status: Married    Spouse name: Not on file  . Number of children: Not on file  . Years of education: Not on file  . Highest education level: Not on file  Occupational History  . Not on file  Tobacco Use  . Smoking status: Never Smoker  . Smokeless tobacco: Never Used  Substance and Sexual Activity  . Alcohol use: Not on file  . Drug use: Not on file  . Sexual activity: Not on file  Other Topics Concern  . Not on file  Social History Narrative  . Not on file   Social Determinants of Health   Financial Resource Strain:   . Difficulty of Paying Living Expenses: Not on file  Food Insecurity:   . Worried About Programme researcher, broadcasting/film/video in the Last Year: Not on file  . Ran Out of Food in the Last Year: Not on file  Transportation Needs:   . Lack of Transportation (Medical): Not on file  . Lack of Transportation (Non-Medical): Not on file  Physical Activity:   . Days of Exercise per Week: Not on file  . Minutes of Exercise per Session: Not on file  Stress:   . Feeling of Stress : Not on file  Social Connections:   . Frequency of Communication with Friends and Family: Not on file  . Frequency of Social Gatherings with Friends and Family: Not on file  . Attends Religious Services: Not on file  . Active Member of Clubs or Organizations: Not on file  . Attends Banker Meetings: Not on file  . Marital Status: Not on file  Intimate Partner Violence:   . Fear of Current or  Ex-Partner: Not on file  . Emotionally Abused: Not on file  . Physically Abused: Not on file  . Sexually Abused: Not on file    Current Outpatient Medications on File Prior to Visit  Medication Sig Dispense Refill  . clomiPHENE (CLOMID) 50 MG tablet TAKE 1/4 TABLET BY MOUTH DAILY (Patient taking differently: TAKE 1/2 TABLET BY MOUTH EVERY OTHER DAY.) 24 tablet 2  . cyclobenzaprine (FLEXERIL) 10 MG tablet Take 1 tablet (10 mg total) by mouth at bedtime. 30 tablet 0  . desvenlafaxine (PRISTIQ) 50 MG 24 hr tablet     . diclofenac (VOLTAREN) 75 MG EC tablet TAKE 1 TABLET (75 MG TOTAL) BY MOUTH 2 (TWO) TIMES DAILY. 50 tablet 0  . Diclofenac Sodium (PENNSAID) 2 % SOLN Place 2 g onto the skin 2 (two) times daily. 112 g 3  . gabapentin (NEURONTIN) 100 MG capsule TAKE 2 CAPSULES BY MOUTH AT BEDTIME 180 capsule 2  . ibuprofen (ADVIL,MOTRIN) 200 MG tablet Take 200 mg by mouth every 6 (six) hours as needed.    . loratadine (CLARITIN) 10 MG tablet Take 10 mg by mouth daily.    . rosuvastatin (CRESTOR) 10 MG tablet Take 10 mg by mouth daily.    . valACYclovir (VALTREX)  500 MG tablet Take 500 mg by mouth 2 (two) times daily.     No current facility-administered medications on file prior to visit.    No Known Allergies  Family History  Problem Relation Age of Onset  . Other Neg Hx        low testosterone    BP 138/82   Pulse 76   Ht 5\' 6"  (1.676 m)   Wt 199 lb (90.3 kg)   SpO2 96%   BMI 32.12 kg/m    Review of Systems Denies sob.      Objective:   Physical Exam VITAL SIGNS:  See vs page GENERAL: no distress Ext: no leg edema  Lab Results  Component Value Date   TESTOSTERONE 471 12/18/2019       Assessment & Plan:  Drowsiness, new, uncertain etiology.  Low testosterone: well-controlled.   Patient Instructions  Please see a sleep specialist.  you will receive a phone call, about a day and time for an appointment Blood tests are requested for you today.  We'll let you know  about the results.  Please come back for a follow-up appointment in 1 year.

## 2019-12-18 NOTE — Patient Instructions (Signed)
Please see a sleep specialist.  you will receive a phone call, about a day and time for an appointment Blood tests are requested for you today.  We'll let you know about the results.  Please come back for a follow-up appointment in 1 year.

## 2019-12-22 LAB — TESTOSTERONE,FREE AND TOTAL
Testosterone, Free: 10.1 pg/mL (ref 6.8–21.5)
Testosterone: 471 ng/dL (ref 264–916)

## 2019-12-24 DIAGNOSIS — F411 Generalized anxiety disorder: Secondary | ICD-10-CM | POA: Diagnosis not present

## 2019-12-24 DIAGNOSIS — F902 Attention-deficit hyperactivity disorder, combined type: Secondary | ICD-10-CM | POA: Diagnosis not present

## 2019-12-24 DIAGNOSIS — F3181 Bipolar II disorder: Secondary | ICD-10-CM | POA: Diagnosis not present

## 2020-02-05 DIAGNOSIS — F429 Obsessive-compulsive disorder, unspecified: Secondary | ICD-10-CM | POA: Diagnosis not present

## 2020-02-05 DIAGNOSIS — G4719 Other hypersomnia: Secondary | ICD-10-CM | POA: Diagnosis not present

## 2020-02-05 DIAGNOSIS — R0683 Snoring: Secondary | ICD-10-CM | POA: Diagnosis not present

## 2020-02-05 DIAGNOSIS — R0681 Apnea, not elsewhere classified: Secondary | ICD-10-CM | POA: Diagnosis not present

## 2020-02-05 DIAGNOSIS — E291 Testicular hypofunction: Secondary | ICD-10-CM | POA: Diagnosis not present

## 2020-02-05 DIAGNOSIS — E7800 Pure hypercholesterolemia, unspecified: Secondary | ICD-10-CM | POA: Diagnosis not present

## 2020-02-06 DIAGNOSIS — F3181 Bipolar II disorder: Secondary | ICD-10-CM | POA: Diagnosis not present

## 2020-02-06 DIAGNOSIS — F411 Generalized anxiety disorder: Secondary | ICD-10-CM | POA: Diagnosis not present

## 2020-02-06 DIAGNOSIS — F902 Attention-deficit hyperactivity disorder, combined type: Secondary | ICD-10-CM | POA: Diagnosis not present

## 2020-02-17 DIAGNOSIS — G4719 Other hypersomnia: Secondary | ICD-10-CM | POA: Diagnosis not present

## 2020-02-17 DIAGNOSIS — R0681 Apnea, not elsewhere classified: Secondary | ICD-10-CM | POA: Diagnosis not present

## 2020-02-17 DIAGNOSIS — R400 Somnolence: Secondary | ICD-10-CM | POA: Diagnosis not present

## 2020-03-04 DIAGNOSIS — F411 Generalized anxiety disorder: Secondary | ICD-10-CM | POA: Diagnosis not present

## 2020-03-04 DIAGNOSIS — F902 Attention-deficit hyperactivity disorder, combined type: Secondary | ICD-10-CM | POA: Diagnosis not present

## 2020-03-04 DIAGNOSIS — F3181 Bipolar II disorder: Secondary | ICD-10-CM | POA: Diagnosis not present

## 2020-03-23 ENCOUNTER — Other Ambulatory Visit: Payer: BC Managed Care – PPO

## 2020-03-23 DIAGNOSIS — Z20822 Contact with and (suspected) exposure to covid-19: Secondary | ICD-10-CM | POA: Diagnosis not present

## 2020-03-23 DIAGNOSIS — E669 Obesity, unspecified: Secondary | ICD-10-CM | POA: Diagnosis not present

## 2020-03-23 DIAGNOSIS — I10 Essential (primary) hypertension: Secondary | ICD-10-CM | POA: Diagnosis not present

## 2020-03-25 LAB — SARS-COV-2, NAA 2 DAY TAT

## 2020-03-25 LAB — NOVEL CORONAVIRUS, NAA: SARS-CoV-2, NAA: NOT DETECTED

## 2020-04-07 DIAGNOSIS — U071 COVID-19: Secondary | ICD-10-CM | POA: Diagnosis not present

## 2020-04-08 DIAGNOSIS — F3181 Bipolar II disorder: Secondary | ICD-10-CM | POA: Diagnosis not present

## 2020-04-08 DIAGNOSIS — F411 Generalized anxiety disorder: Secondary | ICD-10-CM | POA: Diagnosis not present

## 2020-04-08 DIAGNOSIS — F902 Attention-deficit hyperactivity disorder, combined type: Secondary | ICD-10-CM | POA: Diagnosis not present

## 2020-04-23 ENCOUNTER — Ambulatory Visit: Payer: BC Managed Care – PPO | Admitting: Endocrinology

## 2020-04-24 DIAGNOSIS — H524 Presbyopia: Secondary | ICD-10-CM | POA: Diagnosis not present

## 2020-04-29 DIAGNOSIS — D225 Melanocytic nevi of trunk: Secondary | ICD-10-CM | POA: Diagnosis not present

## 2020-04-29 DIAGNOSIS — L821 Other seborrheic keratosis: Secondary | ICD-10-CM | POA: Diagnosis not present

## 2020-04-29 DIAGNOSIS — Z86018 Personal history of other benign neoplasm: Secondary | ICD-10-CM | POA: Diagnosis not present

## 2020-04-29 DIAGNOSIS — L309 Dermatitis, unspecified: Secondary | ICD-10-CM | POA: Diagnosis not present

## 2020-05-03 DIAGNOSIS — H5213 Myopia, bilateral: Secondary | ICD-10-CM | POA: Diagnosis not present

## 2020-05-12 DIAGNOSIS — I10 Essential (primary) hypertension: Secondary | ICD-10-CM | POA: Diagnosis not present

## 2020-05-21 DIAGNOSIS — F411 Generalized anxiety disorder: Secondary | ICD-10-CM | POA: Diagnosis not present

## 2020-05-21 DIAGNOSIS — F3181 Bipolar II disorder: Secondary | ICD-10-CM | POA: Diagnosis not present

## 2020-05-21 DIAGNOSIS — F902 Attention-deficit hyperactivity disorder, combined type: Secondary | ICD-10-CM | POA: Diagnosis not present

## 2020-06-10 DIAGNOSIS — F3181 Bipolar II disorder: Secondary | ICD-10-CM | POA: Diagnosis not present

## 2020-06-10 DIAGNOSIS — F411 Generalized anxiety disorder: Secondary | ICD-10-CM | POA: Diagnosis not present

## 2020-06-10 DIAGNOSIS — F902 Attention-deficit hyperactivity disorder, combined type: Secondary | ICD-10-CM | POA: Diagnosis not present

## 2020-07-07 DIAGNOSIS — F411 Generalized anxiety disorder: Secondary | ICD-10-CM | POA: Diagnosis not present

## 2020-07-07 DIAGNOSIS — F3181 Bipolar II disorder: Secondary | ICD-10-CM | POA: Diagnosis not present

## 2020-07-07 DIAGNOSIS — F902 Attention-deficit hyperactivity disorder, combined type: Secondary | ICD-10-CM | POA: Diagnosis not present

## 2020-08-13 DIAGNOSIS — F319 Bipolar disorder, unspecified: Secondary | ICD-10-CM | POA: Diagnosis not present

## 2020-08-14 ENCOUNTER — Other Ambulatory Visit: Payer: Self-pay | Admitting: Endocrinology

## 2020-08-14 DIAGNOSIS — E230 Hypopituitarism: Secondary | ICD-10-CM

## 2020-08-17 DIAGNOSIS — F902 Attention-deficit hyperactivity disorder, combined type: Secondary | ICD-10-CM | POA: Diagnosis not present

## 2020-08-17 DIAGNOSIS — F411 Generalized anxiety disorder: Secondary | ICD-10-CM | POA: Diagnosis not present

## 2020-08-17 DIAGNOSIS — F3181 Bipolar II disorder: Secondary | ICD-10-CM | POA: Diagnosis not present

## 2020-09-14 DIAGNOSIS — F411 Generalized anxiety disorder: Secondary | ICD-10-CM | POA: Diagnosis not present

## 2020-09-14 DIAGNOSIS — F902 Attention-deficit hyperactivity disorder, combined type: Secondary | ICD-10-CM | POA: Diagnosis not present

## 2020-09-14 DIAGNOSIS — F3181 Bipolar II disorder: Secondary | ICD-10-CM | POA: Diagnosis not present

## 2020-10-23 DIAGNOSIS — Z20822 Contact with and (suspected) exposure to covid-19: Secondary | ICD-10-CM | POA: Diagnosis not present

## 2020-10-23 DIAGNOSIS — U071 COVID-19: Secondary | ICD-10-CM | POA: Diagnosis not present

## 2020-11-25 DIAGNOSIS — F411 Generalized anxiety disorder: Secondary | ICD-10-CM | POA: Diagnosis not present

## 2020-11-25 DIAGNOSIS — F3181 Bipolar II disorder: Secondary | ICD-10-CM | POA: Diagnosis not present

## 2020-11-25 DIAGNOSIS — F902 Attention-deficit hyperactivity disorder, combined type: Secondary | ICD-10-CM | POA: Diagnosis not present

## 2020-12-01 DIAGNOSIS — H5789 Other specified disorders of eye and adnexa: Secondary | ICD-10-CM | POA: Diagnosis not present

## 2020-12-01 DIAGNOSIS — F429 Obsessive-compulsive disorder, unspecified: Secondary | ICD-10-CM | POA: Diagnosis not present

## 2020-12-01 DIAGNOSIS — E7800 Pure hypercholesterolemia, unspecified: Secondary | ICD-10-CM | POA: Diagnosis not present

## 2020-12-01 DIAGNOSIS — I10 Essential (primary) hypertension: Secondary | ICD-10-CM | POA: Diagnosis not present

## 2020-12-18 ENCOUNTER — Ambulatory Visit (INDEPENDENT_AMBULATORY_CARE_PROVIDER_SITE_OTHER): Payer: BC Managed Care – PPO | Admitting: Endocrinology

## 2020-12-18 ENCOUNTER — Other Ambulatory Visit (INDEPENDENT_AMBULATORY_CARE_PROVIDER_SITE_OTHER): Payer: BC Managed Care – PPO

## 2020-12-18 ENCOUNTER — Other Ambulatory Visit: Payer: Self-pay

## 2020-12-18 VITALS — BP 130/80 | HR 68 | Ht 66.0 in | Wt 194.8 lb

## 2020-12-18 DIAGNOSIS — R7989 Other specified abnormal findings of blood chemistry: Secondary | ICD-10-CM

## 2020-12-18 DIAGNOSIS — R202 Paresthesia of skin: Secondary | ICD-10-CM

## 2020-12-18 LAB — BASIC METABOLIC PANEL
BUN: 14 mg/dL (ref 6–23)
CO2: 28 mEq/L (ref 19–32)
Calcium: 9 mg/dL (ref 8.4–10.5)
Chloride: 103 mEq/L (ref 96–112)
Creatinine, Ser: 1.36 mg/dL (ref 0.40–1.50)
GFR: 60.81 mL/min (ref >60.00)
Glucose, Bld: 94 mg/dL (ref 70–99)
Potassium: 4.6 mEq/L (ref 3.5–5.1)
Sodium: 140 mEq/L (ref 135–145)

## 2020-12-18 LAB — CBC WITH DIFFERENTIAL/PLATELET
Basophils Absolute: 0 10*3/uL (ref 0.0–0.1)
Basophils Relative: 0.5 % (ref 0.0–3.0)
Eosinophils Absolute: 0.1 10*3/uL (ref 0.0–0.7)
Eosinophils Relative: 1.6 % (ref 0.0–5.0)
HCT: 39.9 % (ref 39.0–52.0)
Hemoglobin: 13.4 g/dL (ref 13.0–17.0)
Lymphocytes Relative: 22 % (ref 12.0–46.0)
Lymphs Abs: 1.7 10*3/uL (ref 0.7–4.0)
MCHC: 33.5 g/dL (ref 30.0–36.0)
MCV: 89 fl (ref 78.0–100.0)
Monocytes Absolute: 0.4 10*3/uL (ref 0.1–1.0)
Monocytes Relative: 5.5 % (ref 3.0–12.0)
Neutro Abs: 5.5 10*3/uL (ref 1.4–7.7)
Neutrophils Relative %: 70.4 % (ref 43.0–77.0)
Platelets: 276 10*3/uL (ref 150.0–400.0)
RBC: 4.48 Mil/uL (ref 4.22–5.81)
RDW: 13.7 % (ref 11.5–15.5)
WBC: 7.8 10*3/uL (ref 4.0–10.5)

## 2020-12-18 LAB — VITAMIN D 25 HYDROXY (VIT D DEFICIENCY, FRACTURES): VITD: 25.02 ng/mL — ABNORMAL LOW (ref 30.00–100.00)

## 2020-12-18 LAB — VITAMIN B12: Vitamin B-12: 429 pg/mL (ref 211–911)

## 2020-12-18 LAB — TSH: TSH: 1.32 u[IU]/mL (ref 0.35–5.50)

## 2020-12-18 LAB — T4, FREE: Free T4: 0.6 ng/dL (ref 0.60–1.60)

## 2020-12-18 MED ORDER — LISINOPRIL 10 MG PO TABS: 10.0000 mg | ORAL_TABLET | Freq: Every day | ORAL | 3 refills | Status: AC

## 2020-12-18 NOTE — Patient Instructions (Signed)
Please see a sleep specialist.  you will receive a phone call, about a day and time for an appointment Blood tests are requested for you today.  We'll let you know about the results.  Please come back for a follow-up appointment in 1 year.

## 2020-12-18 NOTE — Progress Notes (Signed)
Subjective:    Patient ID: Don Cochran, male    DOB: 1970-08-04, 50 y.o.   MRN: [id number]  HPI Pt returns for f/u of idiopathic central hypogonadism: dx'ed 2020; he has 1 biological child.  He and wife use no contraception; he was rx'ed clomid: pituitary MRI was normal).  pt states he feels well in general, except for slight tremor and acral numbness/tingling.  He takes non-rx Vit-D, uncertain dosage.  Past Medical History:  Diagnosis Date   Allergic rhinitis     No past surgical history on file.  Social History   Socioeconomic History   Marital status: Married    Spouse name: Not on file   Number of children: Not on file   Years of education: Not on file   Highest education level: Not on file  Occupational History   Not on file  Tobacco Use   Smoking status: Never   Smokeless tobacco: Never  Substance and Sexual Activity   Alcohol use: Not on file   Drug use: Not on file   Sexual activity: Not on file  Other Topics Concern   Not on file  Social History Narrative   Not on file   Social Determinants of Health   Financial Resource Strain: Not on file  Food Insecurity: Not on file  Transportation Needs: Not on file  Physical Activity: Not on file  Stress: Not on file  Social Connections: Not on file  Intimate Partner Violence: Not on file    Current Outpatient Medications on File Prior to Visit  Medication Sig Dispense Refill   clomiPHENE (CLOMID) 50 MG tablet TAKE 1/4 TABLET BY MOUTH DAILY 24 tablet 2   cyclobenzaprine (FLEXERIL) 10 MG tablet Take 1 tablet (10 mg total) by mouth at bedtime. 30 tablet 0   desvenlafaxine (PRISTIQ) 50 MG 24 hr tablet      diclofenac (VOLTAREN) 75 MG EC tablet TAKE 1 TABLET (75 MG TOTAL) BY MOUTH 2 (TWO) TIMES DAILY. 50 tablet 0   Diclofenac Sodium (PENNSAID) 2 % SOLN Place 2 g onto the skin 2 (two) times daily. 112 g 3   gabapentin (NEURONTIN) 100 MG capsule TAKE 2 CAPSULES BY MOUTH AT BEDTIME 180 capsule 2   ibuprofen  (ADVIL,MOTRIN) 200 MG tablet Take 200 mg by mouth every 6 (six) hours as needed.     loratadine (CLARITIN) 10 MG tablet Take 10 mg by mouth daily.     rosuvastatin (CRESTOR) 10 MG tablet Take 10 mg by mouth daily.     valACYclovir (VALTREX) 500 MG tablet Take 500 mg by mouth 2 (two) times daily.     No current facility-administered medications on file prior to visit.    No Known Allergies  Family History  Problem Relation Age of Onset   Other Neg Hx        low testosterone    BP 130/80 (BP Location: Right Arm, Patient Position: Sitting, Cuff Size: Normal)   Pulse 68   Ht 5\' 6"  (1.676 m)   Wt 194 lb 12.8 oz (88.4 kg)   SpO2 96%   BMI 31.44 kg/m    Review of Systems Denies ED sxs    Objective:   Physical Exam VITAL SIGNS:  See vs page GENERAL: no distress.   EXT: no leg edema.    Lab Results  Component Value Date   TESTOSTERONE 631 12/18/2020      Assessment & Plan:  Drowsiness, new to me, uncertain etiology and prognosis Low testosterone.  well-controlled.  Please continue the same clomid  Patient Instructions  Please see a sleep specialist.  you will receive a phone call, about a day and time for an appointment Blood tests are requested for you today.  We'll let you know about the results.  Please come back for a follow-up appointment in 1 year.

## 2020-12-21 LAB — PTH, INTACT AND CALCIUM
Calcium: 9.1 mg/dL (ref 8.6–10.3)
PTH: 24 pg/mL (ref 16–77)

## 2020-12-23 LAB — TESTOSTERONE,FREE AND TOTAL
Testosterone, Free: 15 pg/mL (ref 7.2–24.0)
Testosterone: 631 ng/dL (ref 264–916)

## 2020-12-24 ENCOUNTER — Encounter: Payer: Self-pay | Admitting: Endocrinology

## 2021-01-05 DIAGNOSIS — F902 Attention-deficit hyperactivity disorder, combined type: Secondary | ICD-10-CM | POA: Diagnosis not present

## 2021-01-05 DIAGNOSIS — F411 Generalized anxiety disorder: Secondary | ICD-10-CM | POA: Diagnosis not present

## 2021-01-05 DIAGNOSIS — F3181 Bipolar II disorder: Secondary | ICD-10-CM | POA: Diagnosis not present

## 2021-02-03 DIAGNOSIS — F3181 Bipolar II disorder: Secondary | ICD-10-CM | POA: Diagnosis not present

## 2021-02-03 DIAGNOSIS — F411 Generalized anxiety disorder: Secondary | ICD-10-CM | POA: Diagnosis not present

## 2021-02-03 DIAGNOSIS — F902 Attention-deficit hyperactivity disorder, combined type: Secondary | ICD-10-CM | POA: Diagnosis not present

## 2021-03-03 DIAGNOSIS — F902 Attention-deficit hyperactivity disorder, combined type: Secondary | ICD-10-CM | POA: Diagnosis not present

## 2021-03-03 DIAGNOSIS — F411 Generalized anxiety disorder: Secondary | ICD-10-CM | POA: Diagnosis not present

## 2021-03-03 DIAGNOSIS — F3181 Bipolar II disorder: Secondary | ICD-10-CM | POA: Diagnosis not present

## 2021-04-13 DIAGNOSIS — F3181 Bipolar II disorder: Secondary | ICD-10-CM | POA: Diagnosis not present

## 2021-04-13 DIAGNOSIS — F902 Attention-deficit hyperactivity disorder, combined type: Secondary | ICD-10-CM | POA: Diagnosis not present

## 2021-04-13 DIAGNOSIS — F411 Generalized anxiety disorder: Secondary | ICD-10-CM | POA: Diagnosis not present

## 2021-05-04 DIAGNOSIS — Q828 Other specified congenital malformations of skin: Secondary | ICD-10-CM | POA: Diagnosis not present

## 2021-05-04 DIAGNOSIS — L578 Other skin changes due to chronic exposure to nonionizing radiation: Secondary | ICD-10-CM | POA: Diagnosis not present

## 2021-05-04 DIAGNOSIS — Z23 Encounter for immunization: Secondary | ICD-10-CM | POA: Diagnosis not present

## 2021-05-04 DIAGNOSIS — L309 Dermatitis, unspecified: Secondary | ICD-10-CM | POA: Diagnosis not present

## 2021-05-04 DIAGNOSIS — L570 Actinic keratosis: Secondary | ICD-10-CM | POA: Diagnosis not present

## 2021-06-02 DIAGNOSIS — F429 Obsessive-compulsive disorder, unspecified: Secondary | ICD-10-CM | POA: Diagnosis not present

## 2021-06-02 DIAGNOSIS — E291 Testicular hypofunction: Secondary | ICD-10-CM | POA: Diagnosis not present

## 2021-06-02 DIAGNOSIS — E7800 Pure hypercholesterolemia, unspecified: Secondary | ICD-10-CM | POA: Diagnosis not present

## 2021-06-02 DIAGNOSIS — Z23 Encounter for immunization: Secondary | ICD-10-CM | POA: Diagnosis not present

## 2021-06-02 DIAGNOSIS — I10 Essential (primary) hypertension: Secondary | ICD-10-CM | POA: Diagnosis not present

## 2021-06-18 ENCOUNTER — Other Ambulatory Visit: Payer: Self-pay | Admitting: Endocrinology

## 2021-06-18 DIAGNOSIS — E230 Hypopituitarism: Secondary | ICD-10-CM

## 2021-06-22 DIAGNOSIS — H524 Presbyopia: Secondary | ICD-10-CM | POA: Diagnosis not present

## 2021-06-23 DIAGNOSIS — F3181 Bipolar II disorder: Secondary | ICD-10-CM | POA: Diagnosis not present

## 2021-06-23 DIAGNOSIS — F902 Attention-deficit hyperactivity disorder, combined type: Secondary | ICD-10-CM | POA: Diagnosis not present

## 2021-06-23 DIAGNOSIS — F411 Generalized anxiety disorder: Secondary | ICD-10-CM | POA: Diagnosis not present

## 2021-06-28 DIAGNOSIS — H5213 Myopia, bilateral: Secondary | ICD-10-CM | POA: Diagnosis not present

## 2021-07-22 ENCOUNTER — Encounter: Payer: Self-pay | Admitting: Endocrinology

## 2021-07-22 ENCOUNTER — Ambulatory Visit: Payer: BC Managed Care – PPO | Admitting: Endocrinology

## 2021-07-22 VITALS — BP 120/78 | HR 66 | Ht 66.0 in | Wt 195.8 lb

## 2021-07-22 DIAGNOSIS — E230 Hypopituitarism: Secondary | ICD-10-CM

## 2021-07-22 DIAGNOSIS — Z125 Encounter for screening for malignant neoplasm of prostate: Secondary | ICD-10-CM

## 2021-07-22 LAB — PSA: PSA: 1.31 ng/mL (ref 0.10–4.00)

## 2021-07-22 LAB — HEMOGLOBIN AND HEMATOCRIT, BLOOD
HCT: 41.6 % (ref 39.0–52.0)
Hemoglobin: 13.7 g/dL (ref 13.0–17.0)

## 2021-07-22 NOTE — Progress Notes (Signed)
? ?Subjective:  ? ? Patient ID: Don Cochran, male    DOB: 1971/03/06, 51 y.o.   MRN: [id number] ? ?HPI ?Pt returns for f/u of idiopathic central hypogonadism: dx'ed 2020; he has 1 biological child.  He and wife use no contraception; he was rx'ed clomid: pituitary MRI was normal).  pt states he feels well in general.   ?Past Medical History:  ?Diagnosis Date  ? Allergic rhinitis   ? ? ?No past surgical history on file. ? ?Social History  ? ?Socioeconomic History  ? Marital status: Married  ?  Spouse name: Not on file  ? Number of children: Not on file  ? Years of education: Not on file  ? Highest education level: Not on file  ?Occupational History  ? Not on file  ?Tobacco Use  ? Smoking status: Never  ? Smokeless tobacco: Never  ?Substance and Sexual Activity  ? Alcohol use: Not on file  ? Drug use: Not on file  ? Sexual activity: Not on file  ?Other Topics Concern  ? Not on file  ?Social History Narrative  ? Not on file  ? ?Social Determinants of Health  ? ?Financial Resource Strain: Not on file  ?Food Insecurity: Not on file  ?Transportation Needs: Not on file  ?Physical Activity: Not on file  ?Stress: Not on file  ?Social Connections: Not on file  ?Intimate Partner Violence: Not on file  ? ? ?Current Outpatient Medications on File Prior to Visit  ?Medication Sig Dispense Refill  ? clomiPHENE (CLOMID) 50 MG tablet TAKE 1/4 TABLET BY MOUTH DAILY 24 tablet 2  ? cyclobenzaprine (FLEXERIL) 10 MG tablet Take 1 tablet (10 mg total) by mouth at bedtime. 30 tablet 0  ? desvenlafaxine (PRISTIQ) 50 MG 24 hr tablet     ? diclofenac (VOLTAREN) 75 MG EC tablet TAKE 1 TABLET (75 MG TOTAL) BY MOUTH 2 (TWO) TIMES DAILY. 50 tablet 0  ? Diclofenac Sodium (PENNSAID) 2 % SOLN Place 2 g onto the skin 2 (two) times daily. 112 g 3  ? gabapentin (NEURONTIN) 100 MG capsule TAKE 2 CAPSULES BY MOUTH AT BEDTIME 180 capsule 2  ? ibuprofen (ADVIL,MOTRIN) 200 MG tablet Take 200 mg by mouth every 6 (six) hours as needed.    ? lisinopril (ZESTRIL)  10 MG tablet Take 1 tablet (10 mg total) by mouth daily. 90 tablet 3  ? loratadine (CLARITIN) 10 MG tablet Take 10 mg by mouth daily.    ? rosuvastatin (CRESTOR) 10 MG tablet Take 10 mg by mouth daily.    ? valACYclovir (VALTREX) 500 MG tablet Take 500 mg by mouth 2 (two) times daily.    ? ?No current facility-administered medications on file prior to visit.  ? ? ?No Known Allergies ? ?Family History  ?Problem Relation Age of Onset  ? Other Neg Hx   ?     low testosterone  ? ? ?BP 120/78 (BP Location: Left Arm, Patient Position: Sitting, Cuff Size: Normal)   Pulse 66   Ht 5\' 6"  (1.676 m)   Wt 195 lb 12.8 oz (88.8 kg)   SpO2 93%   BMI 31.60 kg/m?  ? ? ?Review of Systems ?Denies decreased urinary stream.   ?   ?Objective:  ? Physical Exam ?VITAL SIGNS:  See vs page.   ?GENERAL: no distress.   ?EXT: no leg edema.   ? ? ?Lab Results  ?Component Value Date  ? TESTOSTERONE 621 07/22/2021  ? ? ?   ?Assessment & Plan:  ?  Low testosterone: well-controlled.  Please continue the same clomid ? ? ?

## 2021-07-22 NOTE — Patient Instructions (Signed)
Blood tests are requested for you today.  We'll let you know about the results.    ?You should have an endocrinology follow-up appointment in 1 year.   ?

## 2021-07-24 LAB — TESTOSTERONE,FREE AND TOTAL
Testosterone, Free: 17.9 pg/mL (ref 7.2–24.0)
Testosterone: 621 ng/dL (ref 264–916)

## 2021-08-18 DIAGNOSIS — F3181 Bipolar II disorder: Secondary | ICD-10-CM | POA: Diagnosis not present

## 2021-08-18 DIAGNOSIS — F411 Generalized anxiety disorder: Secondary | ICD-10-CM | POA: Diagnosis not present

## 2021-08-18 DIAGNOSIS — F902 Attention-deficit hyperactivity disorder, combined type: Secondary | ICD-10-CM | POA: Diagnosis not present

## 2021-10-13 DIAGNOSIS — F3181 Bipolar II disorder: Secondary | ICD-10-CM | POA: Diagnosis not present

## 2021-10-13 DIAGNOSIS — F411 Generalized anxiety disorder: Secondary | ICD-10-CM | POA: Diagnosis not present

## 2021-10-13 DIAGNOSIS — F902 Attention-deficit hyperactivity disorder, combined type: Secondary | ICD-10-CM | POA: Diagnosis not present

## 2021-10-28 DIAGNOSIS — Z23 Encounter for immunization: Secondary | ICD-10-CM | POA: Diagnosis not present

## 2021-12-20 DIAGNOSIS — D120 Benign neoplasm of cecum: Secondary | ICD-10-CM | POA: Diagnosis not present

## 2021-12-20 DIAGNOSIS — Z1211 Encounter for screening for malignant neoplasm of colon: Secondary | ICD-10-CM | POA: Diagnosis not present

## 2021-12-24 ENCOUNTER — Ambulatory Visit: Payer: BC Managed Care – PPO | Admitting: Endocrinology

## 2022-01-05 DIAGNOSIS — F3181 Bipolar II disorder: Secondary | ICD-10-CM | POA: Diagnosis not present

## 2022-01-05 DIAGNOSIS — F902 Attention-deficit hyperactivity disorder, combined type: Secondary | ICD-10-CM | POA: Diagnosis not present

## 2022-01-05 DIAGNOSIS — F411 Generalized anxiety disorder: Secondary | ICD-10-CM | POA: Diagnosis not present

## 2022-01-20 DIAGNOSIS — F319 Bipolar disorder, unspecified: Secondary | ICD-10-CM | POA: Diagnosis not present

## 2022-03-08 ENCOUNTER — Ambulatory Visit: Payer: BC Managed Care – PPO | Admitting: Family Medicine

## 2022-03-11 DIAGNOSIS — F902 Attention-deficit hyperactivity disorder, combined type: Secondary | ICD-10-CM | POA: Diagnosis not present

## 2022-03-11 DIAGNOSIS — F411 Generalized anxiety disorder: Secondary | ICD-10-CM | POA: Diagnosis not present

## 2022-03-11 DIAGNOSIS — F3181 Bipolar II disorder: Secondary | ICD-10-CM | POA: Diagnosis not present

## 2022-03-30 NOTE — Progress Notes (Unsigned)
[name] [address] [place] [address] Phone: [phone] Subjective:   [name], am serving as a scribe for Dr. Antoine Primas.  I'm seeing this patient by the request  of:  Rankins, Fanny Dance, MD  CC: right wrist pain   [id number]  Renji Berwick is a 51 y.o. male coming in with complaint of R wrist, thoracic spine pain, and L heel pain. Last seen in 2020 for B wrist pain. Patient states that over the weekend patient had a sneezing fit and he developed back pain in thoracic spine on L side. Has had pain in both sides of his back in the past couple months.   Also notes pins and needles in his arms when reading in bed or at times with no pattern at all. Notices small tremors in fingers as he hovers them over the phone. Also when he yawns he will get a "tremor" in entire arm, bilaterally. Has had COVID 3x which he feels may contribute to his symptoms.   R thumb pain when he bumps hand on something.   Would like recommendation for PCP as his PCP left the practice they were at.   Patient was given diclofenac when he had plantar fasciitis. Does feel relief when he uses it for musculoskeletal injuries. Took it recently for back pain which has been helpful.         Past Medical History:  Diagnosis Date   Allergic rhinitis    No past surgical history on file. Social History   Socioeconomic History   Marital status: Married    Spouse name: Not on file   Number of children: Not on file   Years of education: Not on file   Highest education level: Not on file  Occupational History   Not on file  Tobacco Use   Smoking status: Never   Smokeless tobacco: Never  Substance and Sexual Activity   Alcohol use: Not on file   Drug use: Not on file   Sexual activity: Not on file  Other Topics Concern   Not on file  Social History Narrative   Not on file   Social Determinants of Health   Financial Resource Strain: Not on file  Food  Insecurity: Not on file  Transportation Needs: Not on file  Physical Activity: Not on file  Stress: Not on file  Social Connections: Not on file   No Known Allergies Family History  Problem Relation Age of Onset   Other Neg Hx        low testosterone    Current Outpatient Medications (Endocrine & Metabolic):    clomiPHENE (CLOMID) 50 MG tablet, TAKE 1/4 TABLET BY MOUTH DAILY  Current Outpatient Medications (Cardiovascular):    lisinopril (ZESTRIL) 10 MG tablet, Take 1 tablet (10 mg total) by mouth daily.   rosuvastatin (CRESTOR) 10 MG tablet, Take 10 mg by mouth daily.  Current Outpatient Medications (Respiratory):    loratadine (CLARITIN) 10 MG tablet, Take 10 mg by mouth daily.  Current Outpatient Medications (Analgesics):    diclofenac (VOLTAREN) 75 MG EC tablet, Take 1 tablet (75 mg total) by mouth 2 (two) times daily.   ibuprofen (ADVIL,MOTRIN) 200 MG tablet, Take 200 mg by mouth every 6 (six) hours as needed.   Current Outpatient Medications (Other):    gabapentin (NEURONTIN) 100 MG capsule, Take 2 capsules (200 mg total) by mouth at bedtime.   valACYclovir (VALTREX) 500 MG tablet, Take 500 mg by mouth 2 (  two) times daily.   Vitamin D, Ergocalciferol, (DRISDOL) 1.25 MG (50000 UNIT) CAPS capsule, Take 1 capsule (50,000 Units total) by mouth every 7 (seven) days.   cyclobenzaprine (FLEXERIL) 10 MG tablet, Take 1 tablet (10 mg total) by mouth at bedtime.   desvenlafaxine (PRISTIQ) 50 MG 24 hr tablet,    Diclofenac Sodium (PENNSAID) 2 % SOLN, Place 2 g onto the skin 2 (two) times daily.   Reviewed prior external information including notes and imaging from  primary care provider As well as notes that were available from care everywhere and other healthcare systems.  Past medical history, social, surgical and family history all reviewed in electronic medical record.  No pertanent information unless stated regarding to the chief complaint.   Review of Systems:  No  headache, visual changes, nausea, vomiting, diarrhea, constipation, dizziness, abdominal pain, skin rash, fevers, chills, night sweats, weight loss, swollen lymph nodes, joint swelling, chest pain, shortness of breath, mood changes. POSITIVE muscle aches, weakness, body aches  Objective  Blood pressure 118/72, pulse 77, height 5\' 6"  (1.676 m), weight 200 lb (90.7 kg), SpO2 96 %.   General: No apparent distress alert and oriented x3 mood and affect normal, dressed appropriately.  HEENT: Pupils equal, extraocular movements intact  Respiratory: Patient's speak in full sentences and does not appear short of breath  Cardiovascular: No lower extremity edema, non tender, no erythema  Right wrist does have good range of motion noted.  Tender to palpation noted minorly.  Patient has good strength noted.  Mild positive Tinel's noted. Patient otherwise has good strength.  Patient does have tenderness to palpation in the medial calcaneal area.  Limited muscular skeletal ultrasound was performed and interpreted by , M   Limited ultrasound of patient's heel does have hypoechoic changes at the calcaneal area with enlargement of the plantar fascia consistent with plantars fasciitis.  Regarding patient's carpal tunnel is noted with mild enlargement of the median nerve on the right side.  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

## 2022-04-05 ENCOUNTER — Encounter: Payer: Self-pay | Admitting: Family Medicine

## 2022-04-05 ENCOUNTER — Ambulatory Visit (INDEPENDENT_AMBULATORY_CARE_PROVIDER_SITE_OTHER): Payer: BC Managed Care – PPO | Admitting: Family Medicine

## 2022-04-05 ENCOUNTER — Ambulatory Visit: Payer: Self-pay

## 2022-04-05 VITALS — BP 118/72 | HR 77 | Ht 66.0 in | Wt 200.0 lb

## 2022-04-05 DIAGNOSIS — E538 Deficiency of other specified B group vitamins: Secondary | ICD-10-CM | POA: Diagnosis not present

## 2022-04-05 DIAGNOSIS — M25531 Pain in right wrist: Secondary | ICD-10-CM

## 2022-04-05 DIAGNOSIS — G5601 Carpal tunnel syndrome, right upper limb: Secondary | ICD-10-CM | POA: Diagnosis not present

## 2022-04-05 DIAGNOSIS — M2550 Pain in unspecified joint: Secondary | ICD-10-CM

## 2022-04-05 DIAGNOSIS — E559 Vitamin D deficiency, unspecified: Secondary | ICD-10-CM

## 2022-04-05 DIAGNOSIS — M722 Plantar fascial fibromatosis: Secondary | ICD-10-CM

## 2022-04-05 MED ORDER — CYANOCOBALAMIN 1000 MCG/ML IJ SOLN
1000.0000 ug | Freq: Once | INTRAMUSCULAR | Status: AC
  Administered 2022-04-05: 1000 ug via INTRAMUSCULAR

## 2022-04-05 MED ORDER — DICLOFENAC SODIUM 75 MG PO TBEC
75.0000 mg | DELAYED_RELEASE_TABLET | Freq: Two times a day (BID) | ORAL | 1 refills | Status: DC
Start: 2022-04-05 — End: 2022-09-26

## 2022-04-05 MED ORDER — VITAMIN D (ERGOCALCIFEROL) 1.25 MG (50000 UNIT) PO CAPS: 50000.0000 [IU] | ORAL_CAPSULE | ORAL | 0 refills | Status: DC

## 2022-04-05 MED ORDER — GABAPENTIN 100 MG PO CAPS: 200.0000 mg | ORAL_CAPSULE | Freq: Every day | ORAL | 0 refills | Status: DC

## 2022-04-05 NOTE — Patient Instructions (Addendum)
Oral voltaren refilled Refilled gabapentin 100-200mg  at night  Once weekly vitamin D for next 12 weeks.  Multi-vitamin with   Zinc 30mg   B12 1086mcg  B6 100mg    Oofos or Hoka recovery sandals in the house Spenco orthotics "total support" online would be great  Rigid sole shoes would be better for now.   B12 injection today   We would like to see you again in 6 weeks and discuss if labs are needed.

## 2022-04-05 NOTE — Assessment & Plan Note (Signed)
Patient does have plantar fasciitis noted.  Discussed which activities to do and which ones to avoid.  Increase activity slowly.  Follow-up again in 6 to 8 weeks.  Discussed proper shoes and recovery sandals.

## 2022-04-05 NOTE — Assessment & Plan Note (Signed)
Patient does have some right carpal tunnel, does have a bifid nerve noted.  Discussed which activities to do and which ones to avoid.  Increase activity slowly.  Discussed bracing at night which patient will likely do.  Follow-up again in 6 to 8 weeks if worsening pain consider injection.

## 2022-04-05 NOTE — Assessment & Plan Note (Signed)
Once weekly prescription was given.

## 2022-04-05 NOTE — Assessment & Plan Note (Signed)
I do believe the patient's hypermobility could be contributing to some of the discomfort and pain as well.  Discussed with patient icing regimen and home exercises.  Patient likely is having some numbness contribute as well.  We discussed which activities to do and which ones to avoid.  Follow-up again in 6 to 8 weeks

## 2022-04-22 ENCOUNTER — Telehealth: Payer: Self-pay | Admitting: Family Medicine

## 2022-04-22 NOTE — Telephone Encounter (Signed)
Patient was reviewing his AVS and had a question regarding the [employer]en[employer] Orthotic Total Support that was recommended.   Will any of them work or is there a particular one? Original, thin, gel?

## 2022-04-26 NOTE — Telephone Encounter (Signed)
Sent patient MyChart message.

## 2022-04-27 ENCOUNTER — Other Ambulatory Visit: Payer: Self-pay

## 2022-04-27 DIAGNOSIS — E230 Hypopituitarism: Secondary | ICD-10-CM

## 2022-04-27 MED ORDER — CLOMIPHENE CITRATE 50 MG PO TABS: ORAL_TABLET | ORAL | 2 refills | Status: DC

## 2022-05-05 DIAGNOSIS — D225 Melanocytic nevi of trunk: Secondary | ICD-10-CM | POA: Diagnosis not present

## 2022-05-05 DIAGNOSIS — L814 Other melanin hyperpigmentation: Secondary | ICD-10-CM | POA: Diagnosis not present

## 2022-05-05 DIAGNOSIS — D485 Neoplasm of uncertain behavior of skin: Secondary | ICD-10-CM | POA: Diagnosis not present

## 2022-05-05 DIAGNOSIS — L578 Other skin changes due to chronic exposure to nonionizing radiation: Secondary | ICD-10-CM | POA: Diagnosis not present

## 2022-05-05 DIAGNOSIS — C4441 Basal cell carcinoma of skin of scalp and neck: Secondary | ICD-10-CM | POA: Diagnosis not present

## 2022-05-05 DIAGNOSIS — L821 Other seborrheic keratosis: Secondary | ICD-10-CM | POA: Diagnosis not present

## 2022-05-16 NOTE — Progress Notes (Signed)
Don Cochran [address] [place] [address] Phone: [phone] Subjective:   Don Cochran, am serving as a scribe for Dr. Antoine Primas.  I'm seeing this patient by the request  of:  Rankins, Fanny Dance, MD  CC: Left foot and right wrist pain  [id number]  04/05/2022 Patient does have plantar fasciitis noted. Discussed which activities to do and which ones to avoid. Increase activity slowly. Follow-up again in 6 to 8 weeks. Discussed proper shoes and recovery sandals.   Once weekly prescription was given.  I do believe the patient's hypermobility could be contributing to some of the discomfort and pain as well.  Discussed with patient icing regimen and home exercises.  Patient likely is having some numbness contribute as well.  We discussed which activities to do and which ones to avoid.  Follow-up again in 6 to 8 weeks      Update 05/17/2022 Don Cochran is a 52 y.o. male coming in with complaint of L foot and R wrist pain. Patient states R thumb pain. Recently hit it and feeling a bit off. Plantar pain seems to be reactivated after being on his feet more than usual lately. He did get the orthotics.  States that that has made some improvement as well noted.  Patient denies any numbness or tingling that is any different than what he was having previously.     Past Medical History:  Diagnosis Date   Allergic rhinitis    No past surgical history on file. Social History   Socioeconomic History   Marital status: Married    Spouse name: Not on file   Number of children: Not on file   Years of education: Not on file   Highest education level: Not on file  Occupational History   Not on file  Tobacco Use   Smoking status: Never   Smokeless tobacco: Never  Substance and Sexual Activity   Alcohol use: Not on file   Drug use: Not on file   Sexual activity: Not on file  Other Topics Concern   Not on file  Social History Narrative   Not on  file   Social Determinants of Health   Financial Resource Strain: Not on file  Food Insecurity: Not on file  Transportation Needs: Not on file  Physical Activity: Not on file  Stress: Not on file  Social Connections: Not on file   No Known Allergies Family History  Problem Relation Age of Onset   Other Neg Hx        low testosterone    Current Outpatient Medications (Endocrine & Metabolic):    clomiPHENE (CLOMID) 50 MG tablet, TAKE 1/4 TABLET BY MOUTH DAILY  Current Outpatient Medications (Cardiovascular):    lisinopril (ZESTRIL) 10 MG tablet, Take 1 tablet (10 mg total) by mouth daily.   rosuvastatin (CRESTOR) 10 MG tablet, Take 10 mg by mouth daily.  Current Outpatient Medications (Respiratory):    loratadine (CLARITIN) 10 MG tablet, Take 10 mg by mouth daily.  Current Outpatient Medications (Analgesics):    diclofenac (VOLTAREN) 75 MG EC tablet, Take 1 tablet (75 mg total) by mouth 2 (two) times daily.   ibuprofen (ADVIL,MOTRIN) 200 MG tablet, Take 200 mg by mouth every 6 (six) hours as needed.   Current Outpatient Medications (Other):    cyclobenzaprine (FLEXERIL) 10 MG tablet, Take 1 tablet (10 mg total) by mouth at bedtime.   desvenlafaxine (PRISTIQ) 50 MG 24 hr tablet,    Diclofenac  Sodium (PENNSAID) 2 % SOLN, Place 2 g onto the skin 2 (two) times daily.   gabapentin (NEURONTIN) 100 MG capsule, Take 2 capsules (200 mg total) by mouth at bedtime.   valACYclovir (VALTREX) 500 MG tablet, Take 500 mg by mouth 2 (two) times daily.   Vitamin D, Ergocalciferol, (DRISDOL) 1.25 MG (50000 UNIT) CAPS capsule, Take 1 capsule (50,000 Units total) by mouth every 7 (seven) days.   Reviewed prior external information including notes and imaging from  primary care provider As well as notes that were available from care everywhere and other healthcare systems.  Past medical history, social, surgical and family history all reviewed in electronic medical record.  No pertanent  information unless stated regarding to the chief complaint.   Review of Systems:  No headache, visual changes, nausea, vomiting, diarrhea, constipation, dizziness, abdominal pain, skin rash, fevers, chills, night sweats, weight loss, swollen lymph nodes, body aches, joint swelling, chest pain, shortness of breath, mood changes. POSITIVE muscle aches  Objective  Blood pressure 122/80, pulse 74, height 5\' 6"  (1.676 m), weight 200 lb (90.7 kg), SpO2 96 %.   General: No apparent distress alert and oriented x3 mood and affect normal, dressed appropriately.  HEENT: Pupils equal, extraocular movements intact  Respiratory: Patient's speak in full sentences and does not appear short of breath  Cardiovascular: No lower extremity edema, non tender, no erythema  Right thumb does have a positive CMC grind test noted.  Patient does have some mild swelling compared to the contralateral side.  Patient does have a mild positive Tinel's noted of the median nerve.  No thenar eminence wasting noted.  Foot exam is nontender on exam.  Mild breakdown of the longitudinal arch noted.  Limited muscular skeletal ultrasound was performed and interpreted by Antoine Primas, M  Limited ultrasound still shows the patient's right median nerve does have a bifid presentation with mild increase in the hypoechoic changes and dilatation.  Patient also has what appears to be hypoechoic changes within the [facility] joint that is concerning for joint effusion. Impression: Right carpal tunnel with CMC effusion     Impression and Recommendations:      The above documentation has been reviewed and is accurate and complete Don Saa, DO

## 2022-05-17 ENCOUNTER — Ambulatory Visit: Payer: Self-pay

## 2022-05-17 ENCOUNTER — Ambulatory Visit (INDEPENDENT_AMBULATORY_CARE_PROVIDER_SITE_OTHER): Payer: BC Managed Care – PPO | Admitting: Family Medicine

## 2022-05-17 ENCOUNTER — Encounter: Payer: Self-pay | Admitting: Family Medicine

## 2022-05-17 VITALS — BP 122/80 | HR 74 | Ht 66.0 in | Wt 200.0 lb

## 2022-05-17 DIAGNOSIS — G5601 Carpal tunnel syndrome, right upper limb: Secondary | ICD-10-CM

## 2022-05-17 DIAGNOSIS — S638X1A Sprain of other part of right wrist and hand, initial encounter: Secondary | ICD-10-CM | POA: Diagnosis not present

## 2022-05-17 NOTE — Assessment & Plan Note (Signed)
Some swelling noted, discussed potential bracing.  Discussed icing regimen.  Patient does have the hypermobility that likely contributes to some of this discomfort as well.  Follow-up again in 6 to 8 weeks worsening pain consider injection

## 2022-05-17 NOTE — Patient Instructions (Signed)
Brace nightly for 2 more week Foot is improving  Keep avoiding being barefoot Check back in 6 weeks for the thumb

## 2022-05-17 NOTE — Assessment & Plan Note (Signed)
Discussed again with great length.  Discussed icing regimen and home exercises, go back to the thumb spica splint and wearing it at night.  Will start doing the exercises more regularly.  Still wants to hold on some of the med patients.  Follow-up with me again in 6 to 8 weeks.

## 2022-06-01 DIAGNOSIS — F902 Attention-deficit hyperactivity disorder, combined type: Secondary | ICD-10-CM | POA: Diagnosis not present

## 2022-06-01 DIAGNOSIS — F411 Generalized anxiety disorder: Secondary | ICD-10-CM | POA: Diagnosis not present

## 2022-06-01 DIAGNOSIS — F3181 Bipolar II disorder: Secondary | ICD-10-CM | POA: Diagnosis not present

## 2022-06-15 DIAGNOSIS — F319 Bipolar disorder, unspecified: Secondary | ICD-10-CM | POA: Diagnosis not present

## 2022-06-20 ENCOUNTER — Telehealth: Payer: Self-pay | Admitting: Family Medicine

## 2022-06-20 NOTE — Telephone Encounter (Signed)
Patient called stating that he was talking to his therapist the other day and mentioned that he is now taking Gabapentin. She asked if he has been having brain fog and the inability to focus, which he has. She said that this can be a symptom of Gabapentin and Bupropion. He stopped taking it for a few days and those symptoms went away but the numbness and tingling in his arms has started to return.  He asked what other options he might have that would help the numbness and tingling but also not mess with the executive function of his brain.   Please advise.

## 2022-06-22 ENCOUNTER — Other Ambulatory Visit: Payer: Self-pay | Admitting: Family Medicine

## 2022-06-22 MED ORDER — DULOXETINE HCL 20 MG PO CPEP: 20.0000 mg | ORAL_CAPSULE | Freq: Every day | ORAL | 0 refills | Status: DC

## 2022-06-22 NOTE — Telephone Encounter (Signed)
Spoke with patient. He wants to try decreasing Gabapentin first instead of taking Cymbalta. Let him know to message [place] through MyChart with updates if he would like

## 2022-06-22 NOTE — Telephone Encounter (Signed)
Talked with patient about Dr. Darliss Ridgel recommendations. Sent in prescription for Cymbalta

## 2022-06-22 NOTE — Telephone Encounter (Signed)
Pt called again, he looked into the Cymbalta and does not feel comfortable with this as he has had negative experiences in the past with "SNRI's".

## 2022-06-28 NOTE — Progress Notes (Signed)
Don Cochran [facility] [address] [place] Phone: [phone] Subjective:   IVilma Cochran, am serving as a scribe for Dr. Hulan Saas.  I'm seeing this patient by the request  of:  Rankins, Don Salinas, MD  CC: right carpal tunnel and thumb pain follow up   [id number]  05/17/2022 Some swelling noted, discussed potential bracing. Discussed icing regimen. Patient does have the hypermobility that likely contributes to some of this discomfort as well. Follow-up again in 6 to 8 weeks worsening pain consider injection   Discussed again with great length. Discussed icing regimen and home exercises, go back to the thumb spica splint and wearing it at night. Will start doing the exercises more regularly. Still wants to hold on some of the med patients. Follow-up with me again in 6 to 8 weeks.   Update 07/06/2022 Don Cochran is a 52 y.o. male coming in with complaint of R carpal tunnel and R CMC joint pain. Patient states wrist, thumb, and feet all doing better with on again off again pain. Thumb can be tender to palpation. No new concerns.      Past Medical History:  Diagnosis Date   Allergic rhinitis    No past surgical history on file. Social History   Socioeconomic History   Marital status: Married    Spouse name: Not on file   Number of children: Not on file   Years of education: Not on file   Highest education level: Not on file  Occupational History   Not on file  Tobacco Use   Smoking status: Never   Smokeless tobacco: Never  Substance and Sexual Activity   Alcohol use: Not on file   Drug use: Not on file   Sexual activity: Not on file  Other Topics Concern   Not on file  Social History Narrative   Not on file   Social Determinants of Health   Financial Resource Strain: Not on file  Food Insecurity: Not on file  Transportation Needs: Not on file  Physical Activity: Not on file  Stress: Not on file  Social Connections:  Not on file   No Known Allergies Family History  Problem Relation Age of Onset   Other Neg Hx        low testosterone    Current Outpatient Medications (Endocrine & Metabolic):    clomiPHENE (CLOMID) 50 MG tablet, TAKE 1/4 TABLET BY MOUTH DAILY  Current Outpatient Medications (Cardiovascular):    lisinopril (ZESTRIL) 10 MG tablet, Take 1 tablet (10 mg total) by mouth daily.   rosuvastatin (CRESTOR) 10 MG tablet, Take 10 mg by mouth daily.  Current Outpatient Medications (Respiratory):    loratadine (CLARITIN) 10 MG tablet, Take 10 mg by mouth daily.  Current Outpatient Medications (Analgesics):    diclofenac (VOLTAREN) 75 MG EC tablet, Take 1 tablet (75 mg total) by mouth 2 (two) times daily.   ibuprofen (ADVIL,MOTRIN) 200 MG tablet, Take 200 mg by mouth every 6 (six) hours as needed.   Current Outpatient Medications (Other):    amoxicillin-clavulanate (AUGMENTIN) 875-125 MG tablet, Take 1 tablet by mouth 2 (two) times daily.   cyclobenzaprine (FLEXERIL) 10 MG tablet, Take 1 tablet (10 mg total) by mouth at bedtime.   desvenlafaxine (PRISTIQ) 50 MG 24 hr tablet,    Diclofenac Sodium (PENNSAID) 2 % SOLN, Place 2 g onto the skin 2 (two) times daily.   DULoxetine (CYMBALTA) 20 MG capsule, Take 1 capsule (20 mg total) by  mouth daily.   gabapentin (NEURONTIN) 100 MG capsule, Take 2 capsules (200 mg total) by mouth at bedtime.   valACYclovir (VALTREX) 500 MG tablet, Take 500 mg by mouth 2 (two) times daily.   Vitamin D, Ergocalciferol, (DRISDOL) 1.25 MG (50000 UNIT) CAPS capsule, Take 1 capsule (50,000 Units total) by mouth every 7 (seven) days.   Reviewed prior external information including notes and imaging from  primary care provider As well as notes that were available from care everywhere and other healthcare systems.  Past medical history, social, surgical and family history all reviewed in electronic medical record.  No pertanent information unless stated regarding to the  chief complaint.   Review of Systems:  No headache, visual changes, nausea, vomiting, diarrhea, constipation, dizziness, abdominal pain, skin rash, fevers, chills, night sweats, weight loss, swollen lymph nodes, body aches, joint swelling, chest pain, shortness of breath, mood changes. POSITIVE muscle aches  Objective  Blood pressure 122/78, pulse 82, height 5\' 6"  (1.676 m), weight 198 lb (89.8 kg), SpO2 98 %.   General: No apparent distress alert and oriented x3 mood and affect normal, dressed appropriately.  HEENT: Pupils equal, extraocular movements intact  Respiratory: Patient's speak in full sentences and does not appear short of breath  Cardiovascular: No lower extremity edema, non tender, no erythema  Hand exam shows patient still has a mild positive Tinel's sign on the right side.  Negative Phalen sign today though.  Good grip strength noted.  CMC joint trace effusion compared to the contralateral side.  Foot exam still shows some mild breakdown of the longitudinal arch noted.  Tender to palpation over the medial calcaneal area but less than previous exam.  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  No significant dilatation noted on today.  CMC joint does still have very mild hypoechoic changes consistent with a small effusion.  Mild narrowing of the joint space. Impression: Interval improvement   Impression and Recommendations:    The above documentation has been reviewed and is accurate and complete Lyndal Pulley, DO

## 2022-07-05 DIAGNOSIS — F3181 Bipolar II disorder: Secondary | ICD-10-CM | POA: Diagnosis not present

## 2022-07-05 DIAGNOSIS — E7800 Pure hypercholesterolemia, unspecified: Secondary | ICD-10-CM | POA: Diagnosis not present

## 2022-07-05 DIAGNOSIS — Z23 Encounter for immunization: Secondary | ICD-10-CM | POA: Diagnosis not present

## 2022-07-05 DIAGNOSIS — R053 Chronic cough: Secondary | ICD-10-CM | POA: Diagnosis not present

## 2022-07-05 DIAGNOSIS — R400 Somnolence: Secondary | ICD-10-CM | POA: Diagnosis not present

## 2022-07-06 ENCOUNTER — Ambulatory Visit: Payer: BC Managed Care – PPO | Admitting: Family Medicine

## 2022-07-06 ENCOUNTER — Encounter: Payer: Self-pay | Admitting: Family Medicine

## 2022-07-06 ENCOUNTER — Other Ambulatory Visit: Payer: Self-pay

## 2022-07-06 VITALS — BP 122/78 | HR 82 | Ht 66.0 in | Wt 198.0 lb

## 2022-07-06 DIAGNOSIS — M722 Plantar fascial fibromatosis: Secondary | ICD-10-CM | POA: Diagnosis not present

## 2022-07-06 DIAGNOSIS — G5601 Carpal tunnel syndrome, right upper limb: Secondary | ICD-10-CM | POA: Diagnosis not present

## 2022-07-06 DIAGNOSIS — S638X1A Sprain of other part of right wrist and hand, initial encounter: Secondary | ICD-10-CM

## 2022-07-06 MED ORDER — AMOXICILLIN-POT CLAVULANATE 875-125 MG PO TABS: 1.0000 | ORAL_TABLET | Freq: Two times a day (BID) | ORAL | 0 refills | Status: DC

## 2022-07-06 NOTE — Assessment & Plan Note (Signed)
Bifid nerve still noted.  Significant decrease in the dilatation of the nerve with patient doing the bracing and intermittent home exercises.  Follow-up with me again 2 months

## 2022-07-06 NOTE — Assessment & Plan Note (Signed)
Arthritic changes noted.  No significant hypoechoic changes still on the present today.  Increase activity slowly.  Follow-up with 2 to 3 months

## 2022-07-06 NOTE — Assessment & Plan Note (Signed)
Discussed feeling secondary to the shoe that he is wearing, the over-the-counter.  Follow-up with me again in 6 to 8 weeks to make sure patient continues improvement

## 2022-07-06 NOTE — Patient Instructions (Addendum)
Heel lifts Everything seems to making good progress Augmentin 875 2x a day for 10 days See you again in 2 months

## 2022-07-18 ENCOUNTER — Encounter: Payer: Self-pay | Admitting: Internal Medicine

## 2022-07-18 ENCOUNTER — Ambulatory Visit: Payer: BC Managed Care – PPO | Admitting: Internal Medicine

## 2022-07-18 VITALS — BP 120/80 | HR 70 | Ht 66.0 in | Wt 196.0 lb

## 2022-07-18 DIAGNOSIS — E230 Hypopituitarism: Secondary | ICD-10-CM | POA: Diagnosis not present

## 2022-07-18 LAB — BASIC METABOLIC PANEL
BUN: 17 mg/dL (ref 6–23)
CO2: 24 mEq/L (ref 19–32)
Calcium: 9 mg/dL (ref 8.4–10.5)
Chloride: 106 mEq/L (ref 96–112)
Creatinine, Ser: 1.37 mg/dL (ref 0.40–1.50)
GFR: 59.61 mL/min — ABNORMAL LOW (ref >60.00)
Glucose, Bld: 106 mg/dL — ABNORMAL HIGH (ref 70–99)
Potassium: 4.5 mEq/L (ref 3.5–5.1)
Sodium: 139 mEq/L (ref 135–145)

## 2022-07-18 LAB — LUTEINIZING HORMONE: LH: 2.77 m[IU]/mL (ref 1.50–9.30)

## 2022-07-18 LAB — T4, FREE: Free T4: 0.76 ng/dL (ref 0.60–1.60)

## 2022-07-18 LAB — TSH: TSH: 1.69 u[IU]/mL (ref 0.35–5.50)

## 2022-07-18 MED ORDER — CLOMIPHENE CITRATE 50 MG PO TABS: ORAL_TABLET | ORAL | 3 refills | Status: DC

## 2022-07-18 NOTE — Progress Notes (Unsigned)
Name: Don Cochran  MRN/ DOB: [id number], 09/22/1970    Age/ Sex: 52 y.o., male     PCP: Rankins, Fanny Dance, MD   Reason for Endocrinology Evaluation: Hypogonadism     Initial Endocrinology Clinic Visit: 06/06/2018    PATIENT IDENTIFIER: Don Cochran is a 52 y.o., male with a past medical history of HTN and dyslipidemia. He has followed with [facility] since 06/06/2018 for consultative assistance with management of his hypogonadism.   HISTORICAL SUMMARY:  Patient was initially seen by Dr. Everardo All 06/2018 for evaluation of low testosterone  Patient has been noted with low testosterone since 2019 with a nadir of 137 NG/DL 10/10/2954   He has 1 biological child.   Pituitary MRI 09/2018   Patient has been on clomiphene SUBJECTIVE:    Today (07/18/2022):  Mr. Don Cochran is here for for follow-up on hypogonadism    No hx of VTE No FH of clotting disorders  Has difficulty maintaining erections  Denies gynecomastia  Denies headaches  Has noted vision changes due to change in glasses prescription , has occasional cloudy vision at times    Clomiphene 50 mg, half a  tab every day   HISTORY:  Past Medical History:  Past Medical History:  Diagnosis Date   Allergic rhinitis    Past Surgical History: No past surgical history on file. Social History:  reports that he has never smoked. He has never used smokeless tobacco. No history on file for alcohol use and drug use. Family History:  Family History  Problem Relation Age of Onset   Other Neg Hx        low testosterone     HOME MEDICATIONS: Allergies as of 07/18/2022   No Known Allergies      Medication List        Accurate as of July 18, 2022  8:52 AM. If you have any questions, ask your nurse or doctor.          STOP taking these medications    cyclobenzaprine 10 MG tablet Commonly known as: FLEXERIL Stopped by: Scarlette Shorts, MD   desvenlafaxine 50 MG 24 hr tablet Commonly known as:  PRISTIQ Stopped by: Scarlette Shorts, MD   Diclofenac Sodium 2 % Soln Commonly known as: Pennsaid Stopped by: Scarlette Shorts, MD       TAKE these medications    amoxicillin-clavulanate 875-125 MG tablet Commonly known as: AUGMENTIN Take 1 tablet by mouth 2 (two) times daily.   buPROPion 300 MG 24 hr tablet Commonly known as: WELLBUTRIN XL Take 300 mg by mouth daily.   busPIRone 15 MG tablet Commonly known as: BUSPAR Take 15 mg by mouth 2 (two) times daily.   clomiPHENE 50 MG tablet Commonly known as: CLOMID TAKE 1/4 TABLET BY MOUTH DAILY   diclofenac 75 MG EC tablet Commonly known as: VOLTAREN Take 1 tablet (75 mg total) by mouth 2 (two) times daily.   DULoxetine 20 MG capsule Commonly known as: Cymbalta Take 1 capsule (20 mg total) by mouth daily.   FLUoxetine 20 MG capsule Commonly known as: PROZAC Take 20 mg by mouth daily.   gabapentin 100 MG capsule Commonly known as: NEURONTIN Take 2 capsules (200 mg total) by mouth at bedtime. What changed: how much to take   ibuprofen 200 MG tablet Commonly known as: ADVIL Take 200 mg by mouth every 6 (six) hours as needed.   lisinopril 10 MG tablet Commonly known as: Zestril Take 1 tablet (10  mg total) by mouth daily.   loratadine 10 MG tablet Commonly known as: CLARITIN Take 10 mg by mouth daily.   rosuvastatin 10 MG tablet Commonly known as: CRESTOR Take 10 mg by mouth daily.   valACYclovir 500 MG tablet Commonly known as: VALTREX Take 500 mg by mouth 2 (two) times daily.   Vitamin D (Ergocalciferol) 1.25 MG (50000 UNIT) Caps capsule Commonly known as: DRISDOL Take 1 capsule (50,000 Units total) by mouth every 7 (seven) days.          OBJECTIVE:   PHYSICAL EXAM: VS: BP 120/80 (BP Location: Left Arm, Patient Position: Sitting, Cuff Size: Large)   Pulse 70   Ht 5\' 6"  (1.676 m)   Wt 196 lb (88.9 kg)   SpO2 96%   BMI 31.64 kg/m    EXAM: General: Pt appears well and is in NAD   Eyes: External eye exam normal without stare, lid lag or exophthalmos.  EOM intact.    Neck: General: Supple without adenopathy. Thyroid: Thyroid size normal.  No goiter or nodules appreciated. No thyroid bruit.  Lungs: Clear with good BS bilat with no rales, rhonchi, or wheezes  Heart: Auscultation: RRR.  Abdomen: Normoactive bowel sounds, soft, nontender, without masses or organomegaly palpable  Extremities:  BL LE: No pretibial edema normal ROM and strength.  Mental Status: Judgment, insight: Intact Orientation: Oriented to time, place, and person Mood and affect: No depression, anxiety, or agitation     DATA REVIEWED: ***    ASSESSMENT / PLAN / RECOMMENDATIONS:   Hypogonadism    Medications   ***   Signed electronically by: Lyndle Herrlich, MD  [facility]  [facility] [address], [address] [place], [place] [address] Phone: [phone] FAX: [phone]      CC: Clayborn Heron, MD [address] [place] [address] Phone: [phone]  Fax: [phone]   Return to Endocrinology clinic as below: Future Appointments  Date Time Provider Department Center  09/06/2022  9:45 AM Judi Saa, DO [facility] None

## 2022-07-19 DIAGNOSIS — C4441 Basal cell carcinoma of skin of scalp and neck: Secondary | ICD-10-CM | POA: Diagnosis not present

## 2022-07-21 LAB — TESTOSTERONE, TOTAL, LC/MS/MS: Testosterone, Total, LC-MS-MS: 607 ng/dL (ref 250–1100)

## 2022-07-21 LAB — PROLACTIN: Prolactin: 10.3 ng/mL (ref 2.0–18.0)

## 2022-08-12 ENCOUNTER — Other Ambulatory Visit: Payer: Self-pay | Admitting: Family Medicine

## 2022-08-18 DIAGNOSIS — F411 Generalized anxiety disorder: Secondary | ICD-10-CM | POA: Diagnosis not present

## 2022-08-18 DIAGNOSIS — F902 Attention-deficit hyperactivity disorder, combined type: Secondary | ICD-10-CM | POA: Diagnosis not present

## 2022-08-18 DIAGNOSIS — F3181 Bipolar II disorder: Secondary | ICD-10-CM | POA: Diagnosis not present

## 2022-09-05 NOTE — Progress Notes (Unsigned)
[name] [address] [place] [address] Phone: [phone] Subjective:   Don Cochran, am serving as a scribe for Dr. Antoine Primas.  I'm seeing this patient by the request  of:  Rankins, Fanny Dance, MD  CC: Right wrist pain follow-up  [id number]  07/06/2022 Arthritic changes noted. No significant hypoechoic changes still on the present today. Increase activity slowly.  Patient was making significant progress follow-up with 2 to 3 months   Bifid nerve still noted.  Significant decrease in the dilatation of the nerve with patient doing the bracing and intermittent home exercises.  Follow-up with me again 2 months      Update 09/06/2022 Don Cochran is a 52 y.o. male coming in with complaint of R carpal tunnel and L plantar fasciitis.  Patient was making significant progress of both of these at last exam.  Patient states that his foot is doing better. Wearing inserts and recovery sandals.   The hand and wrist pain has been intermittent. Unsure if he is making any progress. Taking diclofenac and states that when he stopped for a few days his pain increased. Using gabapentin 100mg  at night. Ran out of medication last week and has only had one day of tingling in the hands. Has been doing HEP. Wearing wrist brace nightly. Now wearing a brace on L hand due to pain in the [facility] jt and wrist.        Past Medical History:  Diagnosis Date   Allergic rhinitis    No past surgical history on file. Social History   Socioeconomic History   Marital status: Married    Spouse name: Not on file   Number of children: Not on file   Years of education: Not on file   Highest education level: Not on file  Occupational History   Not on file  Tobacco Use   Smoking status: Never   Smokeless tobacco: Never  Substance and Sexual Activity   Alcohol use: Not on file   Drug use: Not on file   Sexual activity: Not on file  Other Topics Concern   Not on file   Social History Narrative   Not on file   Social Determinants of Health   Financial Resource Strain: Not on file  Food Insecurity: Not on file  Transportation Needs: Not on file  Physical Activity: Not on file  Stress: Not on file  Social Connections: Not on file   No Known Allergies Family History  Problem Relation Age of Onset   Other Neg Hx        low testosterone    Current Outpatient Medications (Endocrine & Metabolic):    clomiPHENE (CLOMID) 50 MG tablet, Half a tablet every other day  Current Outpatient Medications (Cardiovascular):    lisinopril (ZESTRIL) 10 MG tablet, Take 1 tablet (10 mg total) by mouth daily.   rosuvastatin (CRESTOR) 10 MG tablet, Take 10 mg by mouth daily.  Current Outpatient Medications (Respiratory):    loratadine (CLARITIN) 10 MG tablet, Take 10 mg by mouth daily.  Current Outpatient Medications (Analgesics):    diclofenac (VOLTAREN) 75 MG EC tablet, Take 1 tablet (75 mg total) by mouth 2 (two) times daily.   ibuprofen (ADVIL,MOTRIN) 200 MG tablet, Take 200 mg by mouth every 6 (six) hours as needed.   Current Outpatient Medications (Other):    amoxicillin-clavulanate (AUGMENTIN) 875-125 MG tablet, Take 1 tablet by mouth 2 (two) times daily.   buPROPion (WELLBUTRIN XL) 300 MG  24 hr tablet, Take 300 mg by mouth daily.   busPIRone (BUSPAR) 15 MG tablet, Take 15 mg by mouth 2 (two) times daily.   DULoxetine (CYMBALTA) 20 MG capsule, Take 1 capsule (20 mg total) by mouth daily.   FLUoxetine (PROZAC) 20 MG capsule, Take 20 mg by mouth daily.   gabapentin (NEURONTIN) 100 MG capsule, TAKE 2 CAPSULES BY MOUTH AT BEDTIME.   valACYclovir (VALTREX) 500 MG tablet, Take 500 mg by mouth 2 (two) times daily.   Vitamin D, Ergocalciferol, (DRISDOL) 1.25 MG (50000 UNIT) CAPS capsule, Take 1 capsule (50,000 Units total) by mouth every 7 (seven) days. (Patient not taking: Reported on 07/18/2022)   Reviewed prior external information including notes and imaging  from  primary care provider As well as notes that were available from care everywhere and other healthcare systems.  Past medical history, social, surgical and family history all reviewed in electronic medical record.  No pertanent information unless stated regarding to the chief complaint.   Review of Systems:  No headache, visual changes, nausea, vomiting, diarrhea, constipation, dizziness, abdominal pain, skin rash, fevers, chills, night sweats, weight loss, swollen lymph nodes, body aches, joint swelling, chest pain, shortness of breath, mood changes. POSITIVE muscle aches  Objective  There were no vitals taken for this visit.   General: No apparent distress alert and oriented x3 mood and affect normal, dressed appropriately.  HEENT: Pupils equal, extraocular movements intact  Respiratory: Patient's speak in full sentences and does not appear short of breath  Cardiovascular: No lower extremity edema, non tender, no erythema   Wrist exam shows mild positive Tinel's noted.  Mild positive Finkelstein on the right side as well.  Good grip strength though noted.  Limited muscular skeletal ultrasound was performed and interpreted by Antoine Primas, M  Limited ultrasound does show hypoechoic changes with a bifid median nerve on the right side.  Patient also has some mild hypoechoic and increasing neovascularization in the abductor pollicis longus tendon sheath.  Does appear that the abductor pollicis brevis shares tendon sheath that could be consistent with intersection syndrome. 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

## 2022-09-06 ENCOUNTER — Other Ambulatory Visit: Payer: Self-pay

## 2022-09-06 ENCOUNTER — Encounter: Payer: Self-pay | Admitting: Family Medicine

## 2022-09-06 ENCOUNTER — Ambulatory Visit: Payer: BC Managed Care – PPO | Admitting: Family Medicine

## 2022-09-06 VITALS — BP 114/78 | HR 74 | Ht 66.0 in | Wt 198.0 lb

## 2022-09-06 DIAGNOSIS — M79672 Pain in left foot: Secondary | ICD-10-CM | POA: Diagnosis not present

## 2022-09-06 DIAGNOSIS — S638X1A Sprain of other part of right wrist and hand, initial encounter: Secondary | ICD-10-CM | POA: Diagnosis not present

## 2022-09-06 NOTE — Patient Instructions (Addendum)
Diclofenac 5 day burst 2x a day See you again in 2-3 months

## 2022-09-06 NOTE — Assessment & Plan Note (Signed)
Patient has had a sprain and does have some mild underlying arthritic changes.  Does have right carpal tunnel and does appear to have more of a tendinitis happening to occur at this time.  Discussed with patient about icing regimen and home exercises, which activities to do and which ones to avoid.  Do not think that advanced imaging or nerve conduction study is necessary at this time and would consider more of an injection for diagnostic and therapeutic purposes.  Patient wants to hold at that time and will continue with conservative therapy and follow-up with me again in 6 to 8 weeks

## 2022-09-10 ENCOUNTER — Encounter: Payer: Self-pay | Admitting: Family Medicine

## 2022-09-26 ENCOUNTER — Other Ambulatory Visit: Payer: Self-pay | Admitting: Family Medicine

## 2022-09-26 MED ORDER — DICLOFENAC SODIUM 75 MG PO TBEC: 75.0000 mg | DELAYED_RELEASE_TABLET | Freq: Two times a day (BID) | ORAL | 1 refills | Status: DC

## 2022-09-29 DIAGNOSIS — F3181 Bipolar II disorder: Secondary | ICD-10-CM | POA: Diagnosis not present

## 2022-09-29 DIAGNOSIS — F902 Attention-deficit hyperactivity disorder, combined type: Secondary | ICD-10-CM | POA: Diagnosis not present

## 2022-09-29 DIAGNOSIS — F411 Generalized anxiety disorder: Secondary | ICD-10-CM | POA: Diagnosis not present

## 2022-12-01 DIAGNOSIS — F411 Generalized anxiety disorder: Secondary | ICD-10-CM | POA: Diagnosis not present

## 2022-12-01 DIAGNOSIS — F902 Attention-deficit hyperactivity disorder, combined type: Secondary | ICD-10-CM | POA: Diagnosis not present

## 2022-12-01 DIAGNOSIS — F3181 Bipolar II disorder: Secondary | ICD-10-CM | POA: Diagnosis not present

## 2022-12-12 NOTE — Progress Notes (Unsigned)
Don Cochran [address] [place] [address] Phone: [phone] Subjective:   Don Cochran, am serving as a scribe for Dr. Antoine Cochran.  I'm seeing this patient by the request  of:  Rankins, Don Dance, MD  CC: Left foot and right hand pain  [id number]  09/06/2022 Patient has had a sprain and does have some mild underlying arthritic changes.  Does have right carpal tunnel and does appear to have more of a tendinitis happening to occur at this time.  Discussed with patient about icing regimen and home exercises, which activities to do and which ones to avoid.  Do not think that advanced imaging or nerve conduction study is necessary at this time and would consider more of an injection for diagnostic and therapeutic purposes.  Patient wants to hold at that time and will continue with conservative therapy and follow-up with me again in 6 to 8 weeks      Update 12/13/2022 Don Cochran is a 52 y.o. male coming in with complaint of L foot and R hand pain. Patient states fell in August down stairs of a bus and hurt L shoulder. Still feeling it today. Hoka's are working well with feet. Numbness and tingling in upper back and arms is doing better. PT is working.      Past Medical History:  Diagnosis Date   Allergic rhinitis    No past surgical history on file. Social History   Socioeconomic History   Marital status: Married    Spouse name: Not on file   Number of children: Not on file   Years of education: Not on file   Highest education level: Not on file  Occupational History   Not on file  Tobacco Use   Smoking status: Never   Smokeless tobacco: Never  Substance and Sexual Activity   Alcohol use: Not on file   Drug use: Not on file   Sexual activity: Not on file  Other Topics Concern   Not on file  Social History Narrative   Not on file   Social Determinants of Health   Financial Resource Strain: Not on file  Food Insecurity: Not  on file  Transportation Needs: Not on file  Physical Activity: Not on file  Stress: Not on file  Social Connections: Not on file   No Known Allergies Family History  Problem Relation Age of Onset   Other Neg Hx        low testosterone    Current Outpatient Medications (Endocrine & Metabolic):    clomiPHENE (CLOMID) 50 MG tablet, Half a tablet every other day  Current Outpatient Medications (Cardiovascular):    lisinopril (ZESTRIL) 10 MG tablet, Take 1 tablet (10 mg total) by mouth daily.   rosuvastatin (CRESTOR) 10 MG tablet, Take 10 mg by mouth daily.  Current Outpatient Medications (Respiratory):    loratadine (CLARITIN) 10 MG tablet, Take 10 mg by mouth daily.  Current Outpatient Medications (Analgesics):    diclofenac (VOLTAREN) 75 MG EC tablet, Take 1 tablet (75 mg total) by mouth 2 (two) times daily.   ibuprofen (ADVIL,MOTRIN) 200 MG tablet, Take 200 mg by mouth every 6 (six) hours as needed.   Current Outpatient Medications (Other):    amoxicillin-clavulanate (AUGMENTIN) 875-125 MG tablet, Take 1 tablet by mouth 2 (two) times daily.   buPROPion (WELLBUTRIN XL) 300 MG 24 hr tablet, Take 300 mg by mouth daily.   busPIRone (BUSPAR) 15 MG tablet, Take 15 mg by mouth  2 (two) times daily.   DULoxetine (CYMBALTA) 20 MG capsule, Take 1 capsule (20 mg total) by mouth daily.   FLUoxetine (PROZAC) 20 MG capsule, Take 20 mg by mouth daily.   gabapentin (NEURONTIN) 100 MG capsule, TAKE 2 CAPSULES BY MOUTH AT BEDTIME.   valACYclovir (VALTREX) 500 MG tablet, Take 500 mg by mouth 2 (two) times daily.   Vitamin D, Ergocalciferol, (DRISDOL) 1.25 MG (50000 UNIT) CAPS capsule, Take 1 capsule (50,000 Units total) by mouth every 7 (seven) days.   Reviewed prior external information including notes and imaging from  primary care provider As well as notes that were available from care everywhere and other healthcare systems.  Past medical history, social, surgical and family history all  reviewed in electronic medical record.  No pertanent information unless stated regarding to the chief complaint.   Review of Systems:  No headache, visual changes, nausea, vomiting, diarrhea, constipation, dizziness, abdominal pain, skin rash, fevers, chills, night sweats, weight loss, swollen lymph nodes, body aches, joint swelling, chest pain, shortness of breath, mood changes. POSITIVE muscle aches  Objective  Blood pressure 122/82, pulse 92, height 5\' 6"  (1.676 m), weight 200 lb (90.7 kg), SpO2 98%.   General: No apparent distress alert and oriented x3 mood and affect normal, dressed appropriately.  HEENT: Pupils equal, extraocular movements intact  Respiratory: Patient's speak in full sentences and does not appear short of breath  Cardiovascular: No lower extremity edema, non tender, no erythema  Foot exam seems to be doing relatively well with no significant difficulties. Right hand exam shows good strength noted.  Negative Tinel's. Left shoulder does have positive impingement noted.  Mild positive crossover noted.  Limited muscular skeletal ultrasound was performed and interpreted by Don Cochran, M  Limited ultrasound of patient's rotator cuff does have some hyperechoic changes that is consistent with strain noted. Impression: Rotator cuff strain    Impression and Recommendations:    The above documentation has been reviewed and is accurate and complete Don Saa, DO

## 2022-12-13 ENCOUNTER — Other Ambulatory Visit: Payer: Self-pay

## 2022-12-13 ENCOUNTER — Encounter: Payer: Self-pay | Admitting: Family Medicine

## 2022-12-13 ENCOUNTER — Ambulatory Visit: Payer: BC Managed Care – PPO | Admitting: Family Medicine

## 2022-12-13 VITALS — BP 122/82 | HR 92 | Ht 66.0 in | Wt 200.0 lb

## 2022-12-13 DIAGNOSIS — S46012A Strain of muscle(s) and tendon(s) of the rotator cuff of left shoulder, initial encounter: Secondary | ICD-10-CM

## 2022-12-13 DIAGNOSIS — G5601 Carpal tunnel syndrome, right upper limb: Secondary | ICD-10-CM

## 2022-12-13 NOTE — Assessment & Plan Note (Signed)
No true tear appreciated.  Home exercises given, discussed with patient about icing regimen and home exercises, increase activity slowly.  Follow-up with me again in 6 to 8 weeks otherwise.  If patient is doing well can follow-up with me as needed

## 2022-12-13 NOTE — Patient Instructions (Signed)
Good to see you   

## 2023-01-03 ENCOUNTER — Other Ambulatory Visit: Payer: Self-pay

## 2023-01-03 DIAGNOSIS — E230 Hypopituitarism: Secondary | ICD-10-CM

## 2023-01-03 MED ORDER — CLOMIPHENE CITRATE 50 MG PO TABS
ORAL_TABLET | ORAL | 3 refills | Status: DC
Start: 2023-01-03 — End: 2023-07-25

## 2023-01-04 ENCOUNTER — Other Ambulatory Visit: Payer: Self-pay

## 2023-01-04 DIAGNOSIS — E291 Testicular hypofunction: Secondary | ICD-10-CM | POA: Diagnosis not present

## 2023-01-04 DIAGNOSIS — Z0000 Encounter for general adult medical examination without abnormal findings: Secondary | ICD-10-CM | POA: Diagnosis not present

## 2023-01-04 DIAGNOSIS — Z125 Encounter for screening for malignant neoplasm of prostate: Secondary | ICD-10-CM | POA: Diagnosis not present

## 2023-01-04 DIAGNOSIS — F3181 Bipolar II disorder: Secondary | ICD-10-CM | POA: Diagnosis not present

## 2023-01-04 DIAGNOSIS — E669 Obesity, unspecified: Secondary | ICD-10-CM | POA: Diagnosis not present

## 2023-01-04 DIAGNOSIS — Z1159 Encounter for screening for other viral diseases: Secondary | ICD-10-CM | POA: Diagnosis not present

## 2023-01-04 DIAGNOSIS — Z23 Encounter for immunization: Secondary | ICD-10-CM | POA: Diagnosis not present

## 2023-01-04 DIAGNOSIS — E7800 Pure hypercholesterolemia, unspecified: Secondary | ICD-10-CM | POA: Diagnosis not present

## 2023-01-04 DIAGNOSIS — I10 Essential (primary) hypertension: Secondary | ICD-10-CM | POA: Diagnosis not present

## 2023-01-04 LAB — HEMOGLOBIN A1C: Hemoglobin A1C: 5.9

## 2023-01-04 MED ORDER — DICLOFENAC SODIUM 75 MG PO TBEC: 75.0000 mg | DELAYED_RELEASE_TABLET | Freq: Two times a day (BID) | ORAL | 1 refills | Status: AC

## 2023-01-09 ENCOUNTER — Encounter: Payer: Self-pay | Admitting: Internal Medicine

## 2023-01-11 ENCOUNTER — Telehealth: Payer: Self-pay | Admitting: Family Medicine

## 2023-01-11 ENCOUNTER — Other Ambulatory Visit: Payer: Self-pay | Admitting: Family Medicine

## 2023-01-11 ENCOUNTER — Telehealth: Payer: Self-pay

## 2023-01-11 DIAGNOSIS — R400 Somnolence: Secondary | ICD-10-CM

## 2023-01-11 NOTE — Telephone Encounter (Signed)
Spoke with patient and let him know Dr. Michaelle Copas recommendation. Put in sleep study order.

## 2023-01-11 NOTE — Telephone Encounter (Signed)
Patient called stating that when he first came to see Dr Katrinka Blazing, they discussed back muscle spasms/"muscle clamping" for no reason and then it is very sore after. Dr Katrinka Blazing recommended him to see endocrinology due to low vit d, testosterone, etc. Since then, all of his levels have been well maintained but the muscle spasms have come back very strongly.  He asked if there was anything else that Dr Katrinka Blazing could suggest at this point or what to do from here?  He said that in particular today, it is very painful and he doesn't even think he can sit in the car to drive.  Please advise.

## 2023-01-11 NOTE — Telephone Encounter (Signed)
Patient states that he is having muscle spasms in his back that have gotten more severe. Patient states that his testosterone has been under control and was told that he was having spasms because of low testosterone. Would like to know what he needs to do.

## 2023-01-11 NOTE — Telephone Encounter (Signed)
Place patient on cancellation list. He can do any days except he needs afternoon on Monday and Thursday

## 2023-01-13 ENCOUNTER — Encounter: Payer: Self-pay | Admitting: Internal Medicine

## 2023-01-13 ENCOUNTER — Encounter: Payer: Self-pay | Admitting: Family Medicine

## 2023-01-13 ENCOUNTER — Encounter (HOSPITAL_BASED_OUTPATIENT_CLINIC_OR_DEPARTMENT_OTHER): Payer: Self-pay

## 2023-01-13 ENCOUNTER — Ambulatory Visit: Payer: BC Managed Care – PPO | Admitting: Internal Medicine

## 2023-01-13 VITALS — BP 132/80 | HR 56 | Ht 66.0 in | Wt 200.4 lb

## 2023-01-13 DIAGNOSIS — M62838 Other muscle spasm: Secondary | ICD-10-CM | POA: Insufficient documentation

## 2023-01-13 DIAGNOSIS — E559 Vitamin D deficiency, unspecified: Secondary | ICD-10-CM

## 2023-01-13 DIAGNOSIS — E230 Hypopituitarism: Secondary | ICD-10-CM | POA: Diagnosis not present

## 2023-01-13 LAB — BASIC METABOLIC PANEL
BUN: 17 mg/dL (ref 6–23)
CO2: 28 meq/L (ref 19–32)
Calcium: 9.1 mg/dL (ref 8.4–10.5)
Chloride: 106 meq/L (ref 96–112)
Creatinine, Ser: 1.14 mg/dL (ref 0.40–1.50)
GFR: 74.07 mL/min (ref >60.00)
Glucose, Bld: 92 mg/dL (ref 70–99)
Potassium: 4.6 meq/L (ref 3.5–5.1)
Sodium: 141 meq/L (ref 135–145)

## 2023-01-13 LAB — CBC
HCT: 42.2 % (ref 39.0–52.0)
Hemoglobin: 13.8 g/dL (ref 13.0–17.0)
MCHC: 32.8 g/dL (ref 30.0–36.0)
MCV: 90.8 fL (ref 78.0–100.0)
Platelets: 257 10*3/uL (ref 150.0–400.0)
RBC: 4.65 Mil/uL (ref 4.22–5.81)
RDW: 13 % (ref 11.5–15.5)
WBC: 7.5 10*3/uL (ref 4.0–10.5)

## 2023-01-13 LAB — T4, FREE: Free T4: 0.67 ng/dL (ref 0.60–1.60)

## 2023-01-13 LAB — VITAMIN D 25 HYDROXY (VIT D DEFICIENCY, FRACTURES): VITD: 40.27 ng/mL (ref 30.00–100.00)

## 2023-01-13 LAB — TSH: TSH: 1.52 u[IU]/mL (ref 0.35–5.50)

## 2023-01-13 NOTE — Progress Notes (Signed)
Name: Don Cochran  MRN/ DOB: [id number], Aug 29, 1970    Age/ Sex: 52 y.o., male     PCP: Rankins, Fanny Dance, MD   Reason for Endocrinology Evaluation: Hypogonadism     Initial Endocrinology Clinic Visit: 06/06/2018    PATIENT IDENTIFIER: Mr. Don Cochran is a 52 y.o., male with a past medical history of HTN and dyslipidemia. He has followed with [facility] since 06/06/2018 for consultative assistance with management of his hypogonadism.   HISTORICAL SUMMARY:  Patient was initially seen by Dr. Everardo All 06/2018 for evaluation of low testosterone  Patient has been noted with low testosterone since 2019 with a nadir of 137 NG/DL 04/06/2438   He has 1 biological child.   Pituitary MRI 09/2018   Patient has been on clomiphene SUBJECTIVE:    Today (01/13/2023):  Mr. Don Cochran is here for for follow-up on hypogonadism  Mr. Don Cochran is here to discuss concerns regarding low testosterone and vitamin D.  When he was initially diagnosed with low testosterone he was having muscle cramps .  Yesterday he developed a left back muscle cramp Triggered by stretching at times  Denies ED Denies gynecomastia  Denies headaches  He does arm/shoulder exercises    Clomiphene 50 mg, half a  tab every other day  Vitamin D 400 international unit, 2 caps daily    HISTORY:  Past Medical History:  Past Medical History:  Diagnosis Date   Allergic rhinitis    Past Surgical History: No past surgical history on file. Social History:  reports that he has never smoked. He has never used smokeless tobacco. No history on file for alcohol use and drug use. Family History:  Family History  Problem Relation Age of Onset   Other Neg Hx        low testosterone     HOME MEDICATIONS: Allergies as of 01/13/2023   No Known Allergies      Medication List        Accurate as of January 13, 2023  4:16 PM. If you have any questions, ask your nurse or doctor.          STOP taking these  medications    amoxicillin-clavulanate 875-125 MG tablet Commonly known as: AUGMENTIN Stopped by: Johnney Ou Ernestine Rohman   busPIRone 15 MG tablet Commonly known as: BUSPAR Stopped by: Johnney Ou Iaan Oregel   DULoxetine 20 MG capsule Commonly known as: Cymbalta Stopped by: Johnney Ou Galina Haddox   FLUoxetine 20 MG capsule Commonly known as: PROZAC Stopped by: Johnney Ou Raffaella Edison   gabapentin 100 MG capsule Commonly known as: NEURONTIN Stopped by: Johnney Ou Aaren Krog   Vitamin D (Ergocalciferol) 1.25 MG (50000 UNIT) Caps capsule Commonly known as: DRISDOL Stopped by: Johnney Ou Yutaka Holberg       TAKE these medications    buPROPion 300 MG 24 hr tablet Commonly known as: WELLBUTRIN XL Take 300 mg by mouth daily.   clomiPHENE 50 MG tablet Commonly known as: CLOMID Half a tablet every other day   diclofenac 75 MG EC tablet Commonly known as: VOLTAREN Take 1 tablet (75 mg total) by mouth 2 (two) times daily.   ibuprofen 200 MG tablet Commonly known as: ADVIL Take 200 mg by mouth every 6 (six) hours as needed.   lamoTRIgine 200 MG tablet Commonly known as: LAMICTAL PLEASE SEE ATTACHED FOR DETAILED DIRECTIONS   lisinopril 10 MG tablet Commonly known as: Zestril Take 1 tablet (10 mg total) by mouth daily.   loratadine 10 MG tablet Commonly known  as: CLARITIN Take 10 mg by mouth daily.   rosuvastatin 10 MG tablet Commonly known as: CRESTOR Take 10 mg by mouth daily.   valACYclovir 500 MG tablet Commonly known as: VALTREX Take 500 mg by mouth 2 (two) times daily.          OBJECTIVE:   PHYSICAL EXAM: VS: BP 132/80 (BP Location: Left Arm, Patient Position: Sitting, Cuff Size: Small)   Pulse (!) 56   Ht 5\' 6"  (1.676 m)   Wt 200 lb 6.4 oz (90.9 kg)   SpO2 99%   BMI 32.35 kg/m    EXAM: General: Pt appears well and is in NAD  Neck: General: Supple without adenopathy. Thyroid: Thyroid size normal.  No goiter or nodules appreciated.   Lungs: Clear with  good BS bilat   Heart: Auscultation: RRR.  Abdomen: soft, nontender  Extremities:  BL LE: No pretibial edema   Mental Status: Judgment, insight: Intact Orientation: Oriented to time, place, and person Mood and affect: No depression, anxiety, or agitation     DATA REVIEWED:    Latest Reference Range & Units 01/13/23 11:02  Sodium 135 - 145 mEq/L 141  Potassium 3.5 - 5.1 mEq/L 4.6  Chloride 96 - 112 mEq/L 106  CO2 19 - 32 mEq/L 28  Glucose 70 - 99 mg/dL 92  BUN 6 - 23 mg/dL 17  Creatinine 4.09 - 8.11 mg/dL 9.14  Calcium 8.4 - 78.2 mg/dL 9.1  GFR >95.62 mL/min 74.07  VITD 30.00 - 100.00 ng/mL 40.27  WBC 4.0 - 10.5 K/uL 7.5  RBC 4.22 - 5.81 Mil/uL 4.65  Hemoglobin 13.0 - 17.0 g/dL 13.0  HCT 86.5 - 78.4 % 42.2  MCV 78.0 - 100.0 fl 90.8  MCHC 30.0 - 36.0 g/dL 69.6  RDW 29.5 - 28.4 % 13.0  Platelets 150.0 - 400.0 K/uL 257.0    Latest Reference Range & Units 01/13/23 11:02  TSH 0.35 - 5.50 uIU/mL 1.52  T4,Free(Direct) 0.60 - 1.60 ng/dL 1.32     ASSESSMENT / PLAN / RECOMMENDATIONS:   Hypogonadism  -Unknown cause but this is secondary in nature -He has responded well to clomiphene -He has no side effects -Repeat labs today show normal TFTs, prolactin, normalize LH, testosterone is pending  Medications  Continue clomiphene 50 mg, half a tablet every other day   2. Muscle spasms:  -Patient to take Tylenol, apply heating pad, and stretching exercise -CBC CMP normal   3. Vitamin D Insufficiency :  - Vitamin D remains within normal range   Follow-up in 1 year    Signed electronically by: Lyndle Herrlich, MD  [facility]  [facility] [address] [place] [place], [place] [address] Phone: [phone] FAX: [phone]      CC: Clayborn Heron, MD [address] [place] [address] Phone: [phone]  Fax: [phone]   Return to Endocrinology clinic as below: Future Appointments  Date Time  Provider Department Center  01/23/2023  1:30 PM Jetty Duhamel D, MD [facility] None  02/14/2023 10:30 AM Judi Saa, DO [facility] None  07/25/2023  9:50 AM Nuh Lipton, Konrad Dolores, MD [facility] None

## 2023-01-16 LAB — TESTOSTERONE, TOTAL, LC/MS/MS: Testosterone, Total, LC-MS-MS: 554 ng/dL (ref 250–1100)

## 2023-01-17 ENCOUNTER — Other Ambulatory Visit: Payer: Self-pay | Admitting: Family Medicine

## 2023-01-17 MED ORDER — TIZANIDINE HCL 4 MG PO TABS: 4.0000 mg | ORAL_TABLET | Freq: Every day | ORAL | 0 refills | Status: DC

## 2023-01-18 DIAGNOSIS — F319 Bipolar disorder, unspecified: Secondary | ICD-10-CM | POA: Diagnosis not present

## 2023-01-22 NOTE — Progress Notes (Unsigned)
01/23/23- 52 yoM never smoker for sleep evaluation courtesy of Terrilee Files, DO with concern of drowsiness. Medical problem list includes Allergic Rhinitis, Hypogonadism, Vitamin D deficiency, Polyarthralgia,  Sleep study [facility] -Wellbutrin, Lamictal Epworth score- Body weight today-

## 2023-01-23 ENCOUNTER — Encounter: Payer: Self-pay | Admitting: Internal Medicine

## 2023-01-23 ENCOUNTER — Ambulatory Visit (INDEPENDENT_AMBULATORY_CARE_PROVIDER_SITE_OTHER): Payer: BC Managed Care – PPO | Admitting: Internal Medicine

## 2023-01-23 VITALS — BP 118/70 | HR 84 | Ht 66.0 in | Wt 198.8 lb

## 2023-01-23 DIAGNOSIS — R400 Somnolence: Secondary | ICD-10-CM

## 2023-01-23 DIAGNOSIS — G479 Sleep disorder, unspecified: Secondary | ICD-10-CM | POA: Diagnosis not present

## 2023-01-23 NOTE — Assessment & Plan Note (Signed)
Specific concern about back pain seems to be resolved. Severity of snoring and daytime tiredness hard to judge from his report. Plan- he is going home to discuss with wife. Based on her observation, he will decide whether to do a home sleep test now. Attention to good sleep habits.

## 2023-01-23 NOTE — Assessment & Plan Note (Signed)
Attention to driving safety. Short naps if needed.

## 2023-01-23 NOTE — Patient Instructions (Addendum)
If you decide that you want to recheck the status of your sleep with a sleep study, please let me know and we will arrange it.

## 2023-02-10 NOTE — Progress Notes (Unsigned)
[facility] [address] [place] [address] Phone: [phone] Subjective:   Don Cochran, am serving as a scribe for Dr. Antoine Primas.  I'm seeing this patient by the request  of:  Rankins, Fanny Dance, MD  CC: Muscle cramping especially at night  [id number]  12/13/2022 No true tear appreciated.  Home exercises given, discussed with patient about icing regimen and home exercises, increase activity slowly.  Follow-up with me again in 6 to 8 weeks otherwise.  If patient is doing well can follow-up with me as needed      Update 02/14/2023 Don Cochran is a 52 y.o. male coming in with complaint of L shoulder and R wrist pain. Patient states was having a bad cramp in back a month ago on L lower side. Was not able too much for about 2 days. Right thumb has not gotten better. Everything else has gotten better.       Past Medical History:  Diagnosis Date   Allergic rhinitis    Depression    High blood cholesterol    High blood pressure    Past Surgical History:  Procedure Laterality Date   NASAL SEPTUM SURGERY     Social History   Socioeconomic History   Marital status: Married    Spouse name: Not on file   Number of children: Not on file   Years of education: Not on file   Highest education level: Not on file  Occupational History   Not on file  Tobacco Use   Smoking status: Never   Smokeless tobacco: Never  Substance and Sexual Activity   Alcohol use: Yes    Comment: 0- 2 drinks a day   Drug use: Never   Sexual activity: Not Currently  Other Topics Concern   Not on file  Social History Narrative   Not on file   Social Determinants of Health   Financial Resource Strain: Not on file  Food Insecurity: Not on file  Transportation Needs: Not on file  Physical Activity: Not on file  Stress: Not on file  Social Connections: Not on file   No Known Allergies Family History  Problem Relation Age of Onset   Depression  Mother    Hyperlipidemia Father    Other Neg Hx        low testosterone    Current Outpatient Medications (Endocrine & Metabolic):    clomiPHENE (CLOMID) 50 MG tablet, Half a tablet every other day  Current Outpatient Medications (Cardiovascular):    lisinopril (ZESTRIL) 10 MG tablet, Take 1 tablet (10 mg total) by mouth daily.   rosuvastatin (CRESTOR) 10 MG tablet, Take 10 mg by mouth daily.  Current Outpatient Medications (Respiratory):    loratadine (CLARITIN) 10 MG tablet, Take 10 mg by mouth daily.  Current Outpatient Medications (Analgesics):    diclofenac (VOLTAREN) 75 MG EC tablet, Take 1 tablet (75 mg total) by mouth 2 (two) times daily.   ibuprofen (ADVIL,MOTRIN) 200 MG tablet, Take 200 mg by mouth every 6 (six) hours as needed.   Current Outpatient Medications (Other):    buPROPion (WELLBUTRIN XL) 300 MG 24 hr tablet, Take 300 mg by mouth daily.   lamoTRIgine (LAMICTAL) 200 MG tablet, PLEASE SEE ATTACHED FOR DETAILED DIRECTIONS   tiZANidine (ZANAFLEX) 4 MG tablet, TAKE 1 TABLET BY MOUTH AT BEDTIME.   valACYclovir (VALTREX) 500 MG tablet, Take 500 mg by mouth 2 (two) times daily.     Objective  Blood pressure 114/70,  pulse 79, height 5\' 6"  (1.676 m), weight 195 lb (88.5 kg), SpO2 98%.   General: No apparent distress alert and oriented x3 mood and affect normal, dressed appropriately.  HEENT: Pupils equal, extraocular movements intact  Respiratory: Patient's speak in full sentences and does not appear short of breath  Cardiovascular: No lower extremity edema, non tender, no erythema  Back exam shows patient does have some very mild loss lordosis, poor core strength noted.  Negative straight leg test.  Neurovascularly intact distally. Thumb still has some tenderness to palpation  Limited muscular skeletal ultrasound was performed and interpreted by Antoine Primas, M   Limited ultrasound shows CMC joint still has some mild hypoechoic changes noted.  Do not see any  gapping of the ulnar, no cortical irregularities really noted. Impression: Still has some hypoechoic changes consistent with a sprain   Impression and Recommendations:     The above documentation has been reviewed and is accurate and complete Judi Saa, DO

## 2023-02-14 ENCOUNTER — Encounter: Payer: Self-pay | Admitting: Family Medicine

## 2023-02-14 ENCOUNTER — Other Ambulatory Visit: Payer: Self-pay | Admitting: Family Medicine

## 2023-02-14 ENCOUNTER — Ambulatory Visit (INDEPENDENT_AMBULATORY_CARE_PROVIDER_SITE_OTHER): Payer: BC Managed Care – PPO

## 2023-02-14 ENCOUNTER — Ambulatory Visit (HOSPITAL_COMMUNITY): Payer: BC Managed Care – PPO

## 2023-02-14 ENCOUNTER — Ambulatory Visit: Payer: BC Managed Care – PPO | Admitting: Family Medicine

## 2023-02-14 ENCOUNTER — Other Ambulatory Visit: Payer: Self-pay

## 2023-02-14 VITALS — BP 114/70 | HR 79 | Ht 66.0 in | Wt 195.0 lb

## 2023-02-14 DIAGNOSIS — M545 Low back pain, unspecified: Secondary | ICD-10-CM

## 2023-02-14 DIAGNOSIS — M549 Dorsalgia, unspecified: Secondary | ICD-10-CM | POA: Diagnosis not present

## 2023-02-14 DIAGNOSIS — S638X1A Sprain of other part of right wrist and hand, initial encounter: Secondary | ICD-10-CM

## 2023-02-14 DIAGNOSIS — R7989 Other specified abnormal findings of blood chemistry: Secondary | ICD-10-CM | POA: Diagnosis not present

## 2023-02-14 DIAGNOSIS — M19041 Primary osteoarthritis, right hand: Secondary | ICD-10-CM | POA: Diagnosis not present

## 2023-02-14 DIAGNOSIS — M25531 Pain in right wrist: Secondary | ICD-10-CM

## 2023-02-14 DIAGNOSIS — F319 Bipolar disorder, unspecified: Secondary | ICD-10-CM | POA: Diagnosis not present

## 2023-02-14 DIAGNOSIS — M2550 Pain in unspecified joint: Secondary | ICD-10-CM | POA: Diagnosis not present

## 2023-02-14 NOTE — Assessment & Plan Note (Signed)
Has had hypermobility, could have caused some muscle spasm that he did have.  Nothing that is stopping him from activity at this time.  Discussed icing regimen and home exercises otherwise, increasing activity slowly.  Discussed avoiding certain activities that could cause exacerbation but overall I think patient will do well with this being very seldom.  Make a follow-up in 2 to 3 months just in case.

## 2023-02-14 NOTE — Assessment & Plan Note (Signed)
Being followed by other providers but has been in normal range now.

## 2023-02-14 NOTE — Patient Instructions (Signed)
Good to see you  Xray today of the thumb and low back  New exercises for the core.and the lat a little  Voltaren gel to the thumb 2 times a day for 5 days  See me again in 2-3 months

## 2023-02-14 NOTE — Assessment & Plan Note (Signed)
Continues to have some discomfort of the thumb noted.  X-rays ordered today to further evaluate to make sure there is no Stener lesion.  We would have to consider the possibility of advanced imaging but with the amount of pain patient is having I think this would not change medical management.  Will continue to monitor otherwise.  Unable to tolerate the brace.  Follow-up again in 6 to 8 weeks otherwise.

## 2023-02-23 DIAGNOSIS — F411 Generalized anxiety disorder: Secondary | ICD-10-CM | POA: Diagnosis not present

## 2023-02-23 DIAGNOSIS — F3181 Bipolar II disorder: Secondary | ICD-10-CM | POA: Diagnosis not present

## 2023-02-23 DIAGNOSIS — F902 Attention-deficit hyperactivity disorder, combined type: Secondary | ICD-10-CM | POA: Diagnosis not present

## 2023-03-23 DIAGNOSIS — F319 Bipolar disorder, unspecified: Secondary | ICD-10-CM | POA: Diagnosis not present

## 2023-04-26 DIAGNOSIS — F319 Bipolar disorder, unspecified: Secondary | ICD-10-CM | POA: Diagnosis not present

## 2023-04-27 DIAGNOSIS — F902 Attention-deficit hyperactivity disorder, combined type: Secondary | ICD-10-CM | POA: Diagnosis not present

## 2023-04-27 DIAGNOSIS — F3181 Bipolar II disorder: Secondary | ICD-10-CM | POA: Diagnosis not present

## 2023-04-27 DIAGNOSIS — F411 Generalized anxiety disorder: Secondary | ICD-10-CM | POA: Diagnosis not present

## 2023-05-04 NOTE — Progress Notes (Signed)
 [facility] [address] [place] [address] Phone: [phone] Subjective:   Don Cochran, am serving as a scribe for Dr. Arthea Claudene.  I'm seeing this patient by the request  of:  Rankins, [name], MD  CC: Thumb and back pain follow-up  [name]  02/14/2023 Continues to have some discomfort of the thumb noted.  X-rays ordered today to further evaluate to make sure there is no Stener lesion.  We would have to consider the possibility of advanced imaging but with the amount of pain patient is having I think this would not change medical management.  Will continue to monitor otherwise.  Unable to tolerate the brace.  Follow-up again in 6 to 8 weeks otherwise.     Being followed by other providers but has been in normal range now.   Has had hypermobility, could have caused some muscle spasm that he did have.  Nothing that is stopping him from activity at this time.  Discussed icing regimen and home exercises otherwise, increasing activity slowly.  Discussed avoiding certain activities that could cause exacerbation but overall I think patient will do well with this being very seldom.  Make a follow-up in 2 to 3 months just in case.      Update 05/09/2023 Don Cochran is a 53 y.o. male coming in with complaint of R CMC jt and lower back pain. Patient states lower back has generally been good. Had one event of it seizing up a little bit. His Back is generally sore. Right thumb hasn't been great. Tries not to inflame it. Thumb causes issues from time to time when he does certain movements.        Past Medical History:  Diagnosis Date   Allergic rhinitis    Depression    High blood cholesterol    High blood pressure    Past Surgical History:  Procedure Laterality Date   NASAL SEPTUM SURGERY     Social History   Socioeconomic History   Marital status: Married    Spouse name: Not on file   Number of children: Not on file   Years of  education: Not on file   Highest education level: Not on file  Occupational History   Not on file  Tobacco Use   Smoking status: Never   Smokeless tobacco: Never  Substance and Sexual Activity   Alcohol use: Yes    Comment: 0- 2 drinks a day   Drug use: Never   Sexual activity: Not Currently  Other Topics Concern   Not on file  Social History Narrative   Not on file   Social Drivers of Health   Financial Resource Strain: Not on file  Food Insecurity: Not on file  Transportation Needs: Not on file  Physical Activity: Not on file  Stress: Not on file  Social Connections: Not on file   No Known Allergies Family History  Problem Relation Age of Onset   Depression Mother    Hyperlipidemia Father    Other Neg Hx        low testosterone     Current Outpatient Medications (Endocrine & Metabolic):    clomiPHENE  (CLOMID ) 50 MG tablet, Half a tablet every other day  Current Outpatient Medications (Cardiovascular):    lisinopril  (ZESTRIL ) 10 MG tablet, Take 1 tablet (10 mg total) by mouth daily.   rosuvastatin (CRESTOR) 10 MG tablet, Take 10 mg by mouth daily.  Current Outpatient Medications (Respiratory):    loratadine (CLARITIN)  10 MG tablet, Take 10 mg by mouth daily.  Current Outpatient Medications (Analgesics):    diclofenac  (VOLTAREN ) 75 MG EC tablet, Take 1 tablet (75 mg total) by mouth 2 (two) times daily.   ibuprofen (ADVIL,MOTRIN) 200 MG tablet, Take 200 mg by mouth every 6 (six) hours as needed.   Current Outpatient Medications (Other):    buPROPion (WELLBUTRIN XL) 300 MG 24 hr tablet, Take 300 mg by mouth daily.   lamoTRIgine (LAMICTAL) 200 MG tablet, PLEASE SEE ATTACHED FOR DETAILED DIRECTIONS   tiZANidine  (ZANAFLEX ) 4 MG tablet, TAKE 1 TABLET BY MOUTH AT BEDTIME.   valACYclovir (VALTREX) 500 MG tablet, Take 500 mg by mouth 2 (two) times daily.   Reviewed prior external information including notes and imaging from  primary care provider As well as notes that  were available from care everywhere and other healthcare systems.  Past medical history, social, surgical and family history all reviewed in electronic medical record.  No pertanent information unless stated regarding to the chief complaint.   Review of Systems:  No headache, visual changes, nausea, vomiting, diarrhea, constipation, dizziness, abdominal pain, skin rash, fevers, chills, night sweats, weight loss, swollen lymph nodes, body aches, joint swelling, chest pain, shortness of breath, mood changes. POSITIVE muscle aches  Objective  Blood pressure 130/74, pulse 82, height 5' 5 (1.651 m), weight 196 lb (88.9 kg), SpO2 98%.   General: No apparent distress alert and oriented x3 mood and affect normal, dressed appropriately.  HEENT: Pupils equal, extraocular movements intact  Respiratory: Patient's speak in full sentences and does not appear short of breath  Cardiovascular: No lower extremity edema, non tender, no erythema  Thumb exam shows a patient is on some tenderness over the [facility] joint.  Negative de Quervain's noted.  Back exam does have some loss lordosis and some tenderness to palpation paraspinal musculature.  Tightness with Deri right greater than left.  Osteopathic findings  T5 extended rotated and side bent left L3 flexed rotated and side bent right Sacrum right on right     Impression and Recommendations:    CMC (carpometacarpal joint) sprains, right, initial encounter Continues to have some discomfort and pain of the thumb.  We discussed patient about different things such as bracing, doing the home exercises, icing regimen, discussed which activities to do and which ones to avoid.  Increase activity slowly noted.  Follow-up again in 6 to 8 weeks.  Hypermobility arthralgia Hypermobility of multiple joints and I think is contributing to some of the back pain.  Attempted osteopathic manipulation today.  Discussed icing regimen and home exercises, patient is less than  enthusiastic follow-up again in 6 to 8 weeks.     Decision today to treat with OMT was based on Physical Exam  After verbal consent patient was treated with HVLA, ME, FPR techniques in  thoracic, lumbar and sacral areas, all areas are chronic   Patient tolerated the procedure well with improvement in symptoms  Patient given exercises, stretches and lifestyle modifications  See medications in patient instructions if given  Patient will follow up in 4-8 weeks The above documentation has been reviewed and is accurate and complete Dorea Duff M Lassie Demorest, DO

## 2023-05-09 ENCOUNTER — Other Ambulatory Visit: Payer: Self-pay

## 2023-05-09 ENCOUNTER — Encounter: Payer: Self-pay | Admitting: Family Medicine

## 2023-05-09 ENCOUNTER — Ambulatory Visit: Payer: BC Managed Care – PPO | Admitting: Family Medicine

## 2023-05-09 VITALS — BP 130/74 | HR 82 | Ht 65.0 in | Wt 196.0 lb

## 2023-05-09 DIAGNOSIS — M2550 Pain in unspecified joint: Secondary | ICD-10-CM

## 2023-05-09 DIAGNOSIS — M9903 Segmental and somatic dysfunction of lumbar region: Secondary | ICD-10-CM

## 2023-05-09 DIAGNOSIS — M9904 Segmental and somatic dysfunction of sacral region: Secondary | ICD-10-CM | POA: Diagnosis not present

## 2023-05-09 DIAGNOSIS — S638X1A Sprain of other part of right wrist and hand, initial encounter: Secondary | ICD-10-CM

## 2023-05-09 DIAGNOSIS — M9902 Segmental and somatic dysfunction of thoracic region: Secondary | ICD-10-CM

## 2023-05-09 NOTE — Assessment & Plan Note (Signed)
Hypermobility of multiple joints and I think is contributing to some of the back pain.  Attempted osteopathic manipulation today.  Discussed icing regimen and home exercises, patient is less than enthusiastic follow-up again in 6 to 8 weeks.

## 2023-05-09 NOTE — Assessment & Plan Note (Signed)
 Continues to have some discomfort and pain of the thumb.  We discussed patient about different things such as bracing, doing the home exercises, icing regimen, discussed which activities to do and which ones to avoid.  Increase activity slowly noted.  Follow-up again in 6 to 8 weeks.

## 2023-05-09 NOTE — Patient Instructions (Signed)
Topical antiinflammatory to thumb 2x a day Try to do back exercises more often Tried manipulation today See me again in 3 months

## 2023-05-12 DIAGNOSIS — D225 Melanocytic nevi of trunk: Secondary | ICD-10-CM | POA: Diagnosis not present

## 2023-05-12 DIAGNOSIS — L821 Other seborrheic keratosis: Secondary | ICD-10-CM | POA: Diagnosis not present

## 2023-05-12 DIAGNOSIS — L814 Other melanin hyperpigmentation: Secondary | ICD-10-CM | POA: Diagnosis not present

## 2023-05-12 DIAGNOSIS — L578 Other skin changes due to chronic exposure to nonionizing radiation: Secondary | ICD-10-CM | POA: Diagnosis not present

## 2023-05-25 DIAGNOSIS — F319 Bipolar disorder, unspecified: Secondary | ICD-10-CM | POA: Diagnosis not present

## 2023-06-07 DIAGNOSIS — F319 Bipolar disorder, unspecified: Secondary | ICD-10-CM | POA: Diagnosis not present

## 2023-06-15 DIAGNOSIS — F319 Bipolar disorder, unspecified: Secondary | ICD-10-CM | POA: Diagnosis not present

## 2023-06-21 DIAGNOSIS — F319 Bipolar disorder, unspecified: Secondary | ICD-10-CM | POA: Diagnosis not present

## 2023-06-22 DIAGNOSIS — F902 Attention-deficit hyperactivity disorder, combined type: Secondary | ICD-10-CM | POA: Diagnosis not present

## 2023-06-22 DIAGNOSIS — F411 Generalized anxiety disorder: Secondary | ICD-10-CM | POA: Diagnosis not present

## 2023-06-22 DIAGNOSIS — F3181 Bipolar II disorder: Secondary | ICD-10-CM | POA: Diagnosis not present

## 2023-06-22 DIAGNOSIS — F4312 Post-traumatic stress disorder, chronic: Secondary | ICD-10-CM | POA: Diagnosis not present

## 2023-06-29 DIAGNOSIS — F319 Bipolar disorder, unspecified: Secondary | ICD-10-CM | POA: Diagnosis not present

## 2023-07-07 DIAGNOSIS — H9193 Unspecified hearing loss, bilateral: Secondary | ICD-10-CM | POA: Diagnosis not present

## 2023-07-07 DIAGNOSIS — E7800 Pure hypercholesterolemia, unspecified: Secondary | ICD-10-CM | POA: Diagnosis not present

## 2023-07-07 DIAGNOSIS — E291 Testicular hypofunction: Secondary | ICD-10-CM | POA: Diagnosis not present

## 2023-07-07 DIAGNOSIS — I10 Essential (primary) hypertension: Secondary | ICD-10-CM | POA: Diagnosis not present

## 2023-07-07 DIAGNOSIS — Z23 Encounter for immunization: Secondary | ICD-10-CM | POA: Diagnosis not present

## 2023-07-07 DIAGNOSIS — B009 Herpesviral infection, unspecified: Secondary | ICD-10-CM | POA: Diagnosis not present

## 2023-07-07 DIAGNOSIS — E669 Obesity, unspecified: Secondary | ICD-10-CM | POA: Diagnosis not present

## 2023-07-11 DIAGNOSIS — F319 Bipolar disorder, unspecified: Secondary | ICD-10-CM | POA: Diagnosis not present

## 2023-07-18 ENCOUNTER — Ambulatory Visit: Payer: BC Managed Care – PPO | Admitting: Internal Medicine

## 2023-07-19 DIAGNOSIS — F902 Attention-deficit hyperactivity disorder, combined type: Secondary | ICD-10-CM | POA: Diagnosis not present

## 2023-07-19 DIAGNOSIS — F3181 Bipolar II disorder: Secondary | ICD-10-CM | POA: Diagnosis not present

## 2023-07-19 DIAGNOSIS — F411 Generalized anxiety disorder: Secondary | ICD-10-CM | POA: Diagnosis not present

## 2023-07-19 DIAGNOSIS — F4312 Post-traumatic stress disorder, chronic: Secondary | ICD-10-CM | POA: Diagnosis not present

## 2023-07-20 DIAGNOSIS — F319 Bipolar disorder, unspecified: Secondary | ICD-10-CM | POA: Diagnosis not present

## 2023-07-25 ENCOUNTER — Encounter: Payer: Self-pay | Admitting: Internal Medicine

## 2023-07-25 ENCOUNTER — Ambulatory Visit: Payer: BC Managed Care – PPO | Admitting: Internal Medicine

## 2023-07-25 VITALS — BP 122/70 | HR 56 | Ht 65.0 in | Wt 196.4 lb

## 2023-07-25 DIAGNOSIS — E230 Hypopituitarism: Secondary | ICD-10-CM | POA: Diagnosis not present

## 2023-07-25 MED ORDER — CLOMIPHENE CITRATE 50 MG PO TABS: ORAL_TABLET | ORAL | 3 refills | Status: AC

## 2023-07-25 NOTE — Progress Notes (Unsigned)
 Name: Don Cochran  MRN/ DOB: [id number], 1970/07/30    Age/ Sex: 53 y.o., male     PCP: Rankins, Laurette Pool, MD   Reason for Endocrinology Evaluation: Hypogonadism     Initial Endocrinology Clinic Visit: 06/06/2018    PATIENT IDENTIFIER: Don Cochran is a 53 y.o., male with a past medical history of HTN and dyslipidemia. He has followed with [facility] since 06/06/2018 for consultative assistance with management of his hypogonadism.   HISTORICAL SUMMARY:  Patient was initially seen by Dr. Washington Hacker 06/2018 for evaluation of low testosterone   Patient has been noted with low testosterone  since 2019 with a nadir of 137 NG/DL 0/06/4740   He has 1 biological child.   Pituitary MRI 09/2018   Patient has been on clomiphene  SUBJECTIVE:    Today (07/25/2023):  Don Cochran is here for for follow-up on hypogonadism  Denies ED Denies Spontaneous erections  Denies gynecomastia Has gastric fatty deposit   Denies headaches but has noted vision changes  with close up vision , up to date on eye exams  Denies nausea or vomiting  Denies LE edema  Has right hip pain with jogging   Clomiphene  50 mg, half a  tab ( Monday, Wednesday and Friday)    HISTORY:  Past Medical History:  Past Medical History:  Diagnosis Date   Allergic rhinitis    Depression    High blood cholesterol    High blood pressure    Past Surgical History:  Past Surgical History:  Procedure Laterality Date   NASAL SEPTUM SURGERY     Social History:  reports that he has never smoked. He has never used smokeless tobacco. He reports current alcohol use. He reports that he does not use drugs. Family History:  Family History  Problem Relation Age of Onset   Depression Mother    Hyperlipidemia Father    Other Neg Hx        low testosterone      HOME MEDICATIONS: Allergies as of 07/25/2023   No Known Allergies      Medication List        Accurate as of July 25, 2023 10:08 AM. If you have any  questions, ask your nurse or doctor.          ARIPiprazole 2 MG tablet Commonly known as: ABILIFY 1 tablet Orally Once a day   buPROPion 300 MG 24 hr tablet Commonly known as: WELLBUTRIN XL Take 300 mg by mouth daily.   cholecalciferol 25 MCG (1000 UNIT) tablet Commonly known as: VITAMIN D3 2 tablets Orally Once a day   clomiPHENE  50 MG tablet Commonly known as: CLOMID  Half a tablet every other day   diclofenac  75 MG EC tablet Commonly known as: VOLTAREN  Take 1 tablet (75 mg total) by mouth 2 (two) times daily.   gabapentin  100 MG capsule Commonly known as: NEURONTIN  4 capsules Orally Once a day at bedtime for anxiety and insomnia   ibuprofen 200 MG tablet Commonly known as: ADVIL Take 200 mg by mouth every 6 (six) hours as needed.   lamoTRIgine 200 MG tablet Commonly known as: LAMICTAL PLEASE SEE ATTACHED FOR DETAILED DIRECTIONS   lisinopril  10 MG tablet Commonly known as: Zestril  Take 1 tablet (10 mg total) by mouth daily.   loratadine 10 MG tablet Commonly known as: CLARITIN Take 10 mg by mouth daily.   pyridoxine 100 MG tablet Commonly known as: B-6 1 tablet Orally Once a day   rosuvastatin 10 MG tablet  Commonly known as: CRESTOR Take 10 mg by mouth daily.   tiZANidine  4 MG tablet Commonly known as: ZANAFLEX  TAKE 1 TABLET BY MOUTH AT BEDTIME.   valACYclovir 500 MG tablet Commonly known as: VALTREX Take 500 mg by mouth 2 (two) times daily.          OBJECTIVE:   PHYSICAL EXAM: VS: BP 122/70 (BP Location: Left Arm, Patient Position: Sitting, Cuff Size: Normal)   Pulse (!) 56   Ht 5\' 5"  (1.651 m)   Wt 196 lb 6.4 oz (89.1 kg)   SpO2 97%   BMI 32.68 kg/m    EXAM: General: Pt appears well and is in NAD  Neck: General: Supple without adenopathy. Thyroid : Thyroid  size normal.  No goiter or nodules appreciated.   Lungs: Clear with good BS bilat   Heart: Auscultation: RRR.  Abdomen: soft, nontender  Extremities:  BL LE: No pretibial  edema   Mental Status: Judgment, insight: Intact Orientation: Oriented to time, place, and person Mood and affect: No depression, anxiety, or agitation     DATA REVIEWED:  Labs 07/10/2023 A1c 6.0% H/H13.8/40.7 Glucose 93 BUN 16 Creatinine 1.23 GFR 70 Sodium 139 Potassium 4.6 MA/CR ratio 4.5 HDL 55 Triglycerides 134 LDL 99  ASSESSMENT / PLAN / RECOMMENDATIONS:   Hypogonadism  -Unknown cause but this is secondary in nature -He has responded well to clomiphene  -He has no side effects   Medications  Continue clomiphene  50 mg, half a tablet every other day    Follow-up in 1 year    Signed electronically by: Natale Bail, MD  [facility]  [facility] [address] [place] [place], [place] [address] Phone: [phone] FAX: [phone]      CC: Annamarie Kid, MD [address] [place] [address] Phone: [phone]  Fax: [phone]   Return to Endocrinology clinic as below: Future Appointments  Date Time Provider Department Center  08/07/2023  9:15 AM Isidro Margo, DO [facility] None

## 2023-07-26 ENCOUNTER — Other Ambulatory Visit

## 2023-07-26 DIAGNOSIS — E230 Hypopituitarism: Secondary | ICD-10-CM | POA: Diagnosis not present

## 2023-07-29 LAB — LUTEINIZING HORMONE: LH: 3.9 m[IU]/mL (ref 1.5–9.3)

## 2023-07-29 LAB — TESTOSTERONE, TOTAL, LC/MS/MS: Testosterone, Total, LC-MS-MS: 584 ng/dL (ref 250–1100)

## 2023-07-31 ENCOUNTER — Encounter: Payer: Self-pay | Admitting: Internal Medicine

## 2023-08-02 DIAGNOSIS — F319 Bipolar disorder, unspecified: Secondary | ICD-10-CM | POA: Diagnosis not present

## 2023-08-04 NOTE — Progress Notes (Unsigned)
 [facility] [address] [place] [address] Phone: [phone] Subjective:   Don Cochran, am serving as a scribe for Dr. Ronnell Coins.  I'm seeing this patient by the request  of:  Rankins, Laurette Pool, MD  CC: back and neck and wrist pain   QMV:[id number]  Don Cochran is a 53 y.o. male coming in with complaint of back and neck pain. OMT 05/09/2023. Also f/u for R thumb pain. Patient states that his thumb is improving.   Had a flare of lumbar and cervical spine and saw chiro which improved his pain. Performed lumbar flexion the next day and his pain worsened. Saw chiro and had massage which was helpful. Muscles are sore in lower back up until a few days ago. Had one incidence of tingling in in thumbs and pointer fingers.   Notes R hip pain over lateral aspect after jogging.   Massage therapist told patient he has cysts in lower back on iliac crest proximal to the sacrum. Also notes cysts in linea alba of abdomen and over lower L quadrant/side.   Medications patient has been prescribed: None  Taking:         Reviewed prior external information including notes and imaging from previsou exam, outside providers and external EMR if available.   As well as notes that were available from care everywhere and other healthcare systems.  Past medical history, social, surgical and family history all reviewed in electronic medical record.  No pertanent information unless stated regarding to the chief complaint.   Past Medical History:  Diagnosis Date   Allergic rhinitis    Depression    High blood cholesterol    High blood pressure     No Known Allergies   Review of Systems:  No headache, visual changes, nausea, vomiting, diarrhea, constipation, dizziness, abdominal pain, skin rash, fevers, chills, night sweats, weight loss, swollen lymph nodes, body aches, joint swelling, chest pain, shortness of breath, mood changes. POSITIVE muscle  aches  Objective  Blood pressure 120/82, pulse 75, height 5\' 5"  (1.651 m), weight 198 lb (89.8 kg), SpO2 97%.   General: No apparent distress alert and oriented x3 mood and affect normal, dressed appropriately.  HEENT: Pupils equal, extraocular movements intact  Respiratory: Patient's speak in full sentences and does not appear short of breath  Cardiovascular: No lower extremity edema, non tender, no erythema  Gait MSK:  Back does have some loss lordosis.  Tightness noted in the low back.  Still poor core strength with hip abductor weakness noted. Patient over his body does have areas of some very small masses 1 at the midline.  Seems to be freely movable and tender.  Seems to be likely a lipoma.  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  No muscle invagination noted. Impression: Lipoma  Osteopathic findings  C2 flexed rotated and side bent right C5 flexed rotated and side bent left T3 extended rotated and side bent right inhaled rib T5 extended rotated and side bent left L2 flexed rotated and side bent right Sacrum left on left       Assessment and Plan:  Hypermobility arthralgia Hypermobility still contributing.  Discussed icing regimen of home exercises, discussed posture and ergonomics.  Was found to have more of some hip abductor weakness and  some hip abductor strengthening exercises with athletic trainer today.  Discussed icing regimen and home exercises.  Increase activity slowly.  Follow-up again in 6 to 8 weeks otherwise.  Hip abductor tendinitis, left Tightness noted.  Discussed icing regimen and home exercises, increase activity slowly.  Discussed icing regimen.  Increase activity slowly.  Follow-up again in 6 to 8 weeks.  Work with Event organiser to work on  Armed forces training and education officer problems  Decision today to treat with OMT was based on Physical Exam  After verbal consent patient was treated with HVLA, ME, FPR techniques in cervical, rib, thoracic, lumbar, and sacral  areas  Patient tolerated the procedure well with improvement in symptoms  Patient given exercises, stretches and lifestyle modifications  See medications in patient instructions if given  Patient will follow up in 4-8 weeks    The above documentation has been reviewed and is accurate and complete Don Margo, DO          Note: This dictation was prepared with Dragon dictation along with smaller phrase technology. Any transcriptional errors that result from this process are unintentional.

## 2023-08-07 ENCOUNTER — Other Ambulatory Visit: Payer: Self-pay

## 2023-08-07 ENCOUNTER — Encounter: Payer: Self-pay | Admitting: Family Medicine

## 2023-08-07 ENCOUNTER — Ambulatory Visit: Payer: BC Managed Care – PPO | Admitting: Family Medicine

## 2023-08-07 VITALS — BP 120/82 | HR 75 | Ht 65.0 in | Wt 198.0 lb

## 2023-08-07 DIAGNOSIS — M79644 Pain in right finger(s): Secondary | ICD-10-CM | POA: Diagnosis not present

## 2023-08-07 DIAGNOSIS — D179 Benign lipomatous neoplasm, unspecified: Secondary | ICD-10-CM | POA: Insufficient documentation

## 2023-08-07 DIAGNOSIS — M9904 Segmental and somatic dysfunction of sacral region: Secondary | ICD-10-CM | POA: Diagnosis not present

## 2023-08-07 DIAGNOSIS — M2550 Pain in unspecified joint: Secondary | ICD-10-CM

## 2023-08-07 DIAGNOSIS — M9903 Segmental and somatic dysfunction of lumbar region: Secondary | ICD-10-CM

## 2023-08-07 DIAGNOSIS — M9908 Segmental and somatic dysfunction of rib cage: Secondary | ICD-10-CM

## 2023-08-07 DIAGNOSIS — M9901 Segmental and somatic dysfunction of cervical region: Secondary | ICD-10-CM

## 2023-08-07 DIAGNOSIS — G8929 Other chronic pain: Secondary | ICD-10-CM

## 2023-08-07 DIAGNOSIS — M9902 Segmental and somatic dysfunction of thoracic region: Secondary | ICD-10-CM

## 2023-08-07 DIAGNOSIS — M76892 Other specified enthesopathies of left lower limb, excluding foot: Secondary | ICD-10-CM | POA: Insufficient documentation

## 2023-08-07 NOTE — Assessment & Plan Note (Signed)
 Tightness noted.  Discussed icing regimen and home exercises, increase activity slowly.  Discussed icing regimen.  Increase activity slowly.  Follow-up again in 6 to 8 weeks.  Work with Event organiser to work on Civil engineer, contracting

## 2023-08-07 NOTE — Assessment & Plan Note (Signed)
 Hypermobility still contributing.  Discussed icing regimen of home exercises, discussed posture and ergonomics.  Was found to have more of some hip abductor weakness and some hip abductor strengthening exercises with athletic trainer today.  Discussed icing regimen and home exercises.  Increase activity slowly.  Follow-up again in 6 to 8 weeks otherwise.

## 2023-08-07 NOTE — Assessment & Plan Note (Signed)
 Multiple lipomas noted.  Discussed icing regimen and home exercises.  Discussed which activities to do and which ones to avoid.  Discussed we could do more of a biopsy but do not think it would change in management.

## 2023-08-07 NOTE — Patient Instructions (Signed)
 Lipoma Have a great time [place] the [place]. She you in August.

## 2023-09-04 DIAGNOSIS — F319 Bipolar disorder, unspecified: Secondary | ICD-10-CM | POA: Diagnosis not present

## 2023-09-12 DIAGNOSIS — F4312 Post-traumatic stress disorder, chronic: Secondary | ICD-10-CM | POA: Diagnosis not present

## 2023-09-12 DIAGNOSIS — F411 Generalized anxiety disorder: Secondary | ICD-10-CM | POA: Diagnosis not present

## 2023-09-12 DIAGNOSIS — F3181 Bipolar II disorder: Secondary | ICD-10-CM | POA: Diagnosis not present

## 2023-09-14 DIAGNOSIS — F319 Bipolar disorder, unspecified: Secondary | ICD-10-CM | POA: Diagnosis not present

## 2023-09-27 DIAGNOSIS — F319 Bipolar disorder, unspecified: Secondary | ICD-10-CM | POA: Diagnosis not present

## 2023-10-16 DIAGNOSIS — F319 Bipolar disorder, unspecified: Secondary | ICD-10-CM | POA: Diagnosis not present

## 2023-10-31 DIAGNOSIS — F4312 Post-traumatic stress disorder, chronic: Secondary | ICD-10-CM | POA: Diagnosis not present

## 2023-10-31 DIAGNOSIS — F902 Attention-deficit hyperactivity disorder, combined type: Secondary | ICD-10-CM | POA: Diagnosis not present

## 2023-10-31 DIAGNOSIS — F3181 Bipolar II disorder: Secondary | ICD-10-CM | POA: Diagnosis not present

## 2023-10-31 DIAGNOSIS — F411 Generalized anxiety disorder: Secondary | ICD-10-CM | POA: Diagnosis not present

## 2023-11-01 DIAGNOSIS — F319 Bipolar disorder, unspecified: Secondary | ICD-10-CM | POA: Diagnosis not present

## 2023-11-23 NOTE — Progress Notes (Signed)
 [name] [address] [place] [address] Phone: [phone] Subjective:   Don Cochran, am serving as a scribe for Dr. Arthea Claudene.  I'm seeing this patient by the request  of:  Rankins, [name], MD  CC: Back and neck pain follow-up as well as other joint pain  [name]  Don Cochran is a 53 y.o. male coming in with complaint of back and neck pain. OMT 08/07/2023. Also f/u for R thumb and L hip pain. Patient states that he went to yoga class last night and his back started to both him with lumbar flexion. Otherwise his back has been doing well.   Thumb is not getting any worse.   Working on [employer] for L hip. Seems to be helpful after his runs.   Does still have pins and needles in his arms and hands despite taking gabapentin  and working on HEP.   Medications patient has been prescribed: None          Reviewed prior external information including notes and imaging from previsou exam, outside providers and external EMR if available.   As well as notes that were available from care everywhere and other healthcare systems.  Past medical history, social, surgical and family history all reviewed in electronic medical record.  No pertanent information unless stated regarding to the chief complaint.   Past Medical History:  Diagnosis Date   Allergic rhinitis    Depression    High blood cholesterol    High blood pressure     No Known Allergies   Review of Systems:  No headache, visual changes, nausea, vomiting, diarrhea, constipation, dizziness, abdominal pain, skin rash, fevers, chills, night sweats, weight loss, swollen lymph nodes, body aches, joint swelling, chest pain, shortness of breath, mood changes. POSITIVE muscle aches  Objective  Blood pressure 108/74, pulse 75, height 5' 5 (1.651 m), weight 200 lb (90.7 kg), SpO2 97%.   General: No apparent distress alert and oriented x3 mood and affect normal, dressed  appropriately.  HEENT: Pupils equal, extraocular movements intact  Respiratory: Patient's speak in full sentences and does not appear short of breath  Cardiovascular: No lower extremity edema, non tender, no erythema  Gait relatively normal MSK:  Back still has some loss of lordosis noted.  Does have tightness in the paraspinal musculature of the lumbar spine.  Patient still has some tightness noted around the sacroiliac joint, right greater than left.  Right thumb tender to palpation over the [facility] joint.  Negative Tinel's noted today.  No thenar eminence wasting.  Limited muscular skeletal ultrasound was performed and interpreted by CLAUDENE ARTHEA, M  Limited ultrasound of patient's thumb shows some narrowing noted, no significant hypoechoic changes still noted.  Still some dilatation of the median nerve consistent with some carpal tunnel syndrome.  Osteopathic findings  C2 flexed rotated and side bent right C7 flexed rotated and side bent left T3 extended rotated and side bent right inhaled rib T7 extended rotated and side bent left L2 flexed rotated and side bent right Sacrum right on right     Assessment and Plan:  Hypermobility arthralgia Continuing to work with the hypermobility.  Discussed posture and ergonomics.  We discussed with patient about icing regimen.  Continuing to work on core strength that patient he is inconsistent with.  Increase activity slowly.  Discussed icing regimen.  Follow-up again in 6 to 8 weeks otherwise    Nonallopathic problems  Decision today to treat with OMT  was based on Physical Exam  After verbal consent patient was treated with HVLA, ME, FPR techniques in cervical, rib, thoracic, lumbar, and sacral  areas avoided HVLA on the cervical spine  Patient tolerated the procedure well with improvement in symptoms  Patient given exercises, stretches and lifestyle modifications  See medications in patient instructions if given  Patient will follow  up in 4-8 weeks     The above documentation has been reviewed and is accurate and complete Arthea CHRISTELLA Sharps, DO         Note: This dictation was prepared with Dragon dictation along with smaller phrase technology. Any transcriptional errors that result from this process are unintentional.

## 2023-11-27 ENCOUNTER — Ambulatory Visit (INDEPENDENT_AMBULATORY_CARE_PROVIDER_SITE_OTHER): Admitting: Family Medicine

## 2023-11-27 ENCOUNTER — Encounter: Payer: Self-pay | Admitting: Family Medicine

## 2023-11-27 ENCOUNTER — Other Ambulatory Visit: Payer: Self-pay

## 2023-11-27 ENCOUNTER — Ambulatory Visit (INDEPENDENT_AMBULATORY_CARE_PROVIDER_SITE_OTHER)

## 2023-11-27 VITALS — BP 108/74 | HR 75 | Ht 65.0 in | Wt 200.0 lb

## 2023-11-27 DIAGNOSIS — M5030 Other cervical disc degeneration, unspecified cervical region: Secondary | ICD-10-CM | POA: Diagnosis not present

## 2023-11-27 DIAGNOSIS — G5601 Carpal tunnel syndrome, right upper limb: Secondary | ICD-10-CM | POA: Diagnosis not present

## 2023-11-27 DIAGNOSIS — M2550 Pain in unspecified joint: Secondary | ICD-10-CM | POA: Diagnosis not present

## 2023-11-27 DIAGNOSIS — G8929 Other chronic pain: Secondary | ICD-10-CM

## 2023-11-27 DIAGNOSIS — M25511 Pain in right shoulder: Secondary | ICD-10-CM | POA: Diagnosis not present

## 2023-11-27 DIAGNOSIS — M542 Cervicalgia: Secondary | ICD-10-CM

## 2023-11-27 DIAGNOSIS — M9908 Segmental and somatic dysfunction of rib cage: Secondary | ICD-10-CM

## 2023-11-27 DIAGNOSIS — M9904 Segmental and somatic dysfunction of sacral region: Secondary | ICD-10-CM

## 2023-11-27 DIAGNOSIS — M9902 Segmental and somatic dysfunction of thoracic region: Secondary | ICD-10-CM | POA: Diagnosis not present

## 2023-11-27 DIAGNOSIS — M9903 Segmental and somatic dysfunction of lumbar region: Secondary | ICD-10-CM

## 2023-11-27 DIAGNOSIS — M9901 Segmental and somatic dysfunction of cervical region: Secondary | ICD-10-CM

## 2023-11-27 DIAGNOSIS — M79644 Pain in right finger(s): Secondary | ICD-10-CM

## 2023-11-27 DIAGNOSIS — M4802 Spinal stenosis, cervical region: Secondary | ICD-10-CM | POA: Diagnosis not present

## 2023-11-27 DIAGNOSIS — M47812 Spondylosis without myelopathy or radiculopathy, cervical region: Secondary | ICD-10-CM | POA: Diagnosis not present

## 2023-11-27 NOTE — Assessment & Plan Note (Signed)
 Continuing to work with the hypermobility.  Discussed posture and ergonomics.  We discussed with patient about icing regimen.  Continuing to work on core strength that patient he is inconsistent with.  Increase activity slowly.  Discussed icing regimen.  Follow-up again in 6 to 8 weeks otherwise

## 2023-11-27 NOTE — Patient Instructions (Addendum)
 Xray today Good to see you Steal shoes back from your wife See me in 2-3 months

## 2023-11-27 NOTE — Assessment & Plan Note (Signed)
 Has had carpal tunnel as well as CMC arthritis.  Ulnar compression.  Will get neck x-rays to further evaluate.  Discussed icing regimen and home exercises, discussed which activities to do and which ones to avoid.  Increase activity slowly.  Discussed icing regimen.  Follow-up again in 6 to 8 weeks otherwise.

## 2023-11-30 DIAGNOSIS — F319 Bipolar disorder, unspecified: Secondary | ICD-10-CM | POA: Diagnosis not present

## 2023-12-09 ENCOUNTER — Ambulatory Visit: Payer: Self-pay | Admitting: Family Medicine

## 2023-12-14 DIAGNOSIS — F319 Bipolar disorder, unspecified: Secondary | ICD-10-CM | POA: Diagnosis not present

## 2023-12-20 DIAGNOSIS — H10022 Other mucopurulent conjunctivitis, left eye: Secondary | ICD-10-CM | POA: Diagnosis not present

## 2023-12-26 DIAGNOSIS — F319 Bipolar disorder, unspecified: Secondary | ICD-10-CM | POA: Diagnosis not present

## 2024-01-02 DIAGNOSIS — F4312 Post-traumatic stress disorder, chronic: Secondary | ICD-10-CM | POA: Diagnosis not present

## 2024-01-02 DIAGNOSIS — F411 Generalized anxiety disorder: Secondary | ICD-10-CM | POA: Diagnosis not present

## 2024-01-02 DIAGNOSIS — F3181 Bipolar II disorder: Secondary | ICD-10-CM | POA: Diagnosis not present

## 2024-01-02 DIAGNOSIS — F902 Attention-deficit hyperactivity disorder, combined type: Secondary | ICD-10-CM | POA: Diagnosis not present

## 2024-01-08 DIAGNOSIS — E7800 Pure hypercholesterolemia, unspecified: Secondary | ICD-10-CM | POA: Diagnosis not present

## 2024-01-08 DIAGNOSIS — Z125 Encounter for screening for malignant neoplasm of prostate: Secondary | ICD-10-CM | POA: Diagnosis not present

## 2024-01-08 DIAGNOSIS — E669 Obesity, unspecified: Secondary | ICD-10-CM | POA: Diagnosis not present

## 2024-01-08 DIAGNOSIS — I10 Essential (primary) hypertension: Secondary | ICD-10-CM | POA: Diagnosis not present

## 2024-01-09 DIAGNOSIS — E7800 Pure hypercholesterolemia, unspecified: Secondary | ICD-10-CM | POA: Diagnosis not present

## 2024-01-09 DIAGNOSIS — Z125 Encounter for screening for malignant neoplasm of prostate: Secondary | ICD-10-CM | POA: Diagnosis not present

## 2024-01-09 DIAGNOSIS — Z0189 Encounter for other specified special examinations: Secondary | ICD-10-CM | POA: Diagnosis not present

## 2024-01-09 DIAGNOSIS — E669 Obesity, unspecified: Secondary | ICD-10-CM | POA: Diagnosis not present

## 2024-01-09 DIAGNOSIS — I10 Essential (primary) hypertension: Secondary | ICD-10-CM | POA: Diagnosis not present

## 2024-01-25 NOTE — Progress Notes (Signed)
 [facility] [address] [place] [address] Phone: [phone] Subjective:   LILLETTE Don Cochran, am serving as a scribe for Dr. Arthea Claudene.  I'Don seeing this patient by the request  of:  Rankins, [name], MD  CC: Back and neck pain  YEP:Don Cochran  Don Cochran is a 53 y.o. male coming in with complaint of back and neck pain. OMT 11/27/2023. Patient states that his back and neck are the same as last visit.   Pain in L foot over medial longitudinal arch. Worse after being sedentary. Pain resolves quickly.   Medications patient has been prescribed: None  Taking:      Reviewed prior external information including notes and imaging from previsou exam, outside providers and external EMR if available.   As well as notes that were available from care everywhere and other healthcare systems.  Past medical history, social, surgical and family history all reviewed in electronic medical record.  No pertanent information unless stated regarding to the chief complaint.   Past Medical History:  Diagnosis Date   Allergic rhinitis    Depression    High blood cholesterol    High blood pressure     No Known Allergies   Review of Systems:  No headache, visual changes, nausea, vomiting, diarrhea, constipation, dizziness, abdominal pain, skin rash, fevers, chills, night sweats, weight loss, swollen lymph nodes, body aches, joint swelling, chest pain, shortness of breath, mood changes. POSITIVE muscle aches  Objective  Blood pressure 104/62, pulse 85, height 5' 5 (1.651 Don), weight 202 lb (91.6 kg), SpO2 98%.   General: No apparent distress alert and oriented x3 mood and affect normal, dressed appropriately.  HEENT: Pupils equal, extraocular movements intact  Respiratory: Patient's speak in full sentences and does not appear short of breath  Cardiovascular: No lower extremity edema, non tender, no erythema  Gait MSK:  Back does have some loss of  lordosis noted.  Some tenderness to palpation in the paraspinal musculature.  Neck exam does have some limited sidebending bilaterally. Exam shows a negative Tinel's today but still some discomfort with Phalen's noted.  Thumb does have some pain and certain range of motion noted. Osteopathic findings  C2 flexed rotated and side bent right C6 flexed rotated and side bent left T3 extended rotated and side bent right inhaled rib T9 extended rotated and side bent left L2 flexed rotated and side bent right L3 flexed rotated and side bent left Sacrum right on right       Assessment and Plan:  Right carpal tunnel syndrome Known carpal tunnel as well.  Continue to work on air cabin crew, discussed the cell phone again.  Worsening pain can consider injections.  I do think a lot of it is more secondary to repetitive motions and anything else at this time.  Follow-up again in 2 to 3 months.  Plantar fasciitis of left foot Mild exacerbation, discussed with patient about icing regimen and home exercises, discussed which activities to do and which ones to avoid.  Increase activity slowly.  Follow-up again in 6 to 8 weeks  Hypermobility arthralgia Hypermobility noted that likely contributes to some of the discomfort and pain of the joints as well.  Likely patient has improved in the strength especially core stability and hip abductor strength.  Do believe that this is going to help overall.  Discussed icing regimen and home exercises, increase activity slowly.  Follow-up again in 6 to 12 weeks.    Nonallopathic  problems  Decision today to treat with OMT was based on Physical Exam  After verbal consent patient was treated with HVLA, ME, FPR techniques in cervical, rib, thoracic, lumbar, and sacral  areas  Patient tolerated the procedure well with improvement in symptoms  Patient given exercises, stretches and lifestyle modifications  See medications in patient instructions if  given  Patient will follow up in 4-8 weeks      The above documentation has been reviewed and is accurate and complete Don Cochran Don Alessander Sikorski, DO        Note: This dictation was prepared with Dragon dictation along with smaller phrase technology. Any transcriptional errors that result from this process are unintentional.

## 2024-01-30 ENCOUNTER — Encounter: Payer: Self-pay | Admitting: Family Medicine

## 2024-01-30 ENCOUNTER — Ambulatory Visit: Admitting: Family Medicine

## 2024-01-30 VITALS — BP 104/62 | HR 85 | Ht 65.0 in | Wt 202.0 lb

## 2024-01-30 DIAGNOSIS — G5601 Carpal tunnel syndrome, right upper limb: Secondary | ICD-10-CM

## 2024-01-30 DIAGNOSIS — M9902 Segmental and somatic dysfunction of thoracic region: Secondary | ICD-10-CM | POA: Diagnosis not present

## 2024-01-30 DIAGNOSIS — M2550 Pain in unspecified joint: Secondary | ICD-10-CM

## 2024-01-30 DIAGNOSIS — M9904 Segmental and somatic dysfunction of sacral region: Secondary | ICD-10-CM

## 2024-01-30 DIAGNOSIS — M9908 Segmental and somatic dysfunction of rib cage: Secondary | ICD-10-CM | POA: Diagnosis not present

## 2024-01-30 DIAGNOSIS — M722 Plantar fascial fibromatosis: Secondary | ICD-10-CM | POA: Diagnosis not present

## 2024-01-30 DIAGNOSIS — M9901 Segmental and somatic dysfunction of cervical region: Secondary | ICD-10-CM

## 2024-01-30 DIAGNOSIS — M9903 Segmental and somatic dysfunction of lumbar region: Secondary | ICD-10-CM

## 2024-01-30 NOTE — Assessment & Plan Note (Signed)
 Hypermobility noted that likely contributes to some of the discomfort and pain of the joints as well.  Likely patient has improved in the strength especially core stability and hip abductor strength.  Do believe that this is going to help overall.  Discussed icing regimen and home exercises, increase activity slowly.  Follow-up again in 6 to 12 weeks.

## 2024-01-30 NOTE — Assessment & Plan Note (Signed)
 Mild exacerbation, discussed with patient about icing regimen and home exercises, discussed which activities to do and which ones to avoid.  Increase activity slowly.  Follow-up again in 6 to 8 weeks

## 2024-01-30 NOTE — Patient Instructions (Signed)
 Good to see you Go shoe shopping See me again in 2-3 months

## 2024-01-30 NOTE — Assessment & Plan Note (Signed)
 Known carpal tunnel as well.  Continue to work on air cabin crew, discussed the cell phone again.  Worsening pain can consider injections.  I do think a lot of it is more secondary to repetitive motions and anything else at this time.  Follow-up again in 2 to 3 months.

## 2024-02-12 DIAGNOSIS — F902 Attention-deficit hyperactivity disorder, combined type: Secondary | ICD-10-CM | POA: Diagnosis not present

## 2024-02-12 DIAGNOSIS — F4312 Post-traumatic stress disorder, chronic: Secondary | ICD-10-CM | POA: Diagnosis not present

## 2024-02-12 DIAGNOSIS — F3181 Bipolar II disorder: Secondary | ICD-10-CM | POA: Diagnosis not present

## 2024-02-12 DIAGNOSIS — F411 Generalized anxiety disorder: Secondary | ICD-10-CM | POA: Diagnosis not present

## 2024-02-13 DIAGNOSIS — F319 Bipolar disorder, unspecified: Secondary | ICD-10-CM | POA: Diagnosis not present

## 2024-03-05 DIAGNOSIS — I10 Essential (primary) hypertension: Secondary | ICD-10-CM | POA: Diagnosis not present

## 2024-03-05 DIAGNOSIS — E7800 Pure hypercholesterolemia, unspecified: Secondary | ICD-10-CM | POA: Diagnosis not present

## 2024-03-22 DIAGNOSIS — F319 Bipolar disorder, unspecified: Secondary | ICD-10-CM | POA: Diagnosis not present

## 2024-04-26 NOTE — Progress Notes (Signed)
 " [name] [address] [place] [address] Phone: [phone] Subjective:   LILLETTE Claretha Schimke am a scribe for Dr. Claudene.   I'm seeing this patient by the request  of:  Rankins, [name], MD  CC: Back and neck pain follow-up  [name]  Byrant Valent is a 54 y.o. male coming in with complaint of back and neck pain. OMT 01/30/2024. L foot and R wrist f/u. Patient states that the left foot is doing pretty well today. Right wrist is ok but the right thumb is still an issue and he has to be conscious of it at all times. Back and neck is generally doing pretty well. He is a little tight today.   Medications patient has been prescribed: None  Taking:         Reviewed prior external information including notes and imaging from previsou exam, outside providers and external EMR if available.   As well as notes that were available from care everywhere and other healthcare systems.  Past medical history, social, surgical and family history all reviewed in electronic medical record.  No pertanent information unless stated regarding to the chief complaint.   Past Medical History:  Diagnosis Date   Allergic rhinitis    Depression    High blood cholesterol    High blood pressure     Allergies[1]   Review of Systems:  No headache, visual changes, nausea, vomiting, diarrhea, constipation, dizziness, abdominal pain, skin rash, fevers, chills, night sweats, weight loss, swollen lymph nodes, body aches, joint swelling, chest pain, shortness of breath, mood changes. POSITIVE muscle aches  Objective  Blood pressure 102/60, pulse 67, height 5' 5 (1.651 m), weight 199 lb 3.2 oz (90.4 kg), SpO2 97%.   General: No apparent distress alert and oriented x3 mood and affect normal, dressed appropriately.  HEENT: Pupils equal, extraocular movements intact  Respiratory: Patient's speak in full sentences and does not appear short of breath  Cardiovascular:  No lower extremity edema, non tender, no erythema  Gait relatively normal MSK:  Back some mild loss of lordosis but nothing severe.  Still with tightness of the neck mostly in the occipital area.  Osteopathic findings  C2 flexed rotated and side bent right C6 flexed rotated and side bent left T3 extended rotated and side bent right inhaled rib T9 extended rotated and side bent left L2 flexed rotated and side bent right Sacrum right on right     Assessment and Plan:  Hypermobility arthralgia Patient is doing remarkably well.  Continue with the strengthening.  Has been doing running and do feel that patient is doing well with body composition.  No other significant changes.  May be consider core strengthening but I believe patient will do well and follow-up with me again in 6 to 8 weeks.    Nonallopathic problems  Decision today to treat with OMT was based on Physical Exam  After verbal consent patient was treated with HVLA, ME, FPR techniques in cervical, rib, thoracic, lumbar, and sacral  areas  Patient tolerated the procedure well with improvement in symptoms  Patient given exercises, stretches and lifestyle modifications  See medications in patient instructions if given  Patient will follow up in 4-8 weeks    The above documentation has been reviewed and is accurate and complete Tanya Marvin M Monalisa Bayless, DO          Note: This dictation was prepared with Dragon dictation along with smaller phrase technology. Any transcriptional errors  that result from this process are unintentional.            [1] No Known Allergies  "

## 2024-05-01 ENCOUNTER — Other Ambulatory Visit: Payer: Self-pay

## 2024-05-01 ENCOUNTER — Ambulatory Visit: Admitting: Family Medicine

## 2024-05-01 VITALS — BP 102/60 | HR 67 | Ht 65.0 in | Wt 199.2 lb

## 2024-05-01 DIAGNOSIS — S638X1A Sprain of other part of right wrist and hand, initial encounter: Secondary | ICD-10-CM

## 2024-05-01 DIAGNOSIS — M9902 Segmental and somatic dysfunction of thoracic region: Secondary | ICD-10-CM

## 2024-05-01 DIAGNOSIS — M2550 Pain in unspecified joint: Secondary | ICD-10-CM

## 2024-05-01 DIAGNOSIS — M9901 Segmental and somatic dysfunction of cervical region: Secondary | ICD-10-CM | POA: Diagnosis not present

## 2024-05-01 DIAGNOSIS — M79672 Pain in left foot: Secondary | ICD-10-CM | POA: Diagnosis not present

## 2024-05-01 DIAGNOSIS — M9908 Segmental and somatic dysfunction of rib cage: Secondary | ICD-10-CM

## 2024-05-01 DIAGNOSIS — M722 Plantar fascial fibromatosis: Secondary | ICD-10-CM

## 2024-05-01 DIAGNOSIS — M9903 Segmental and somatic dysfunction of lumbar region: Secondary | ICD-10-CM

## 2024-05-01 DIAGNOSIS — M9904 Segmental and somatic dysfunction of sacral region: Secondary | ICD-10-CM | POA: Diagnosis not present

## 2024-05-01 NOTE — Assessment & Plan Note (Signed)
 Patient is doing remarkably well.  Continue with the strengthening.  Has been doing running and do feel that patient is doing well with body composition.  No other significant changes.  May be consider core strengthening but I believe patient will do well and follow-up with me again in 6 to 8 weeks.

## 2024-05-01 NOTE — Assessment & Plan Note (Signed)
 Stable with no changes.  Continue to monitor.

## 2024-05-01 NOTE — Patient Instructions (Addendum)
 Good to see you. I think you are making great strides. Continue the running when [place] is done.  See me again in 3 to 4 months.

## 2024-05-01 NOTE — Assessment & Plan Note (Signed)
 Stable at this moment that has responded well to conservative therapy and better shoes

## 2024-07-23 ENCOUNTER — Ambulatory Visit: Admitting: Internal Medicine

## 2024-08-29 ENCOUNTER — Ambulatory Visit: Admitting: Family Medicine
